# Patient Record
Sex: Female | Born: 1945
Health system: Southern US, Community
[De-identification: ages and names within clinical notes are randomized; demographics above are authoritative.]

## PROBLEM LIST (undated history)

## (undated) DIAGNOSIS — G43909 Migraine, unspecified, not intractable, without status migrainosus: Secondary | ICD-10-CM

## (undated) DIAGNOSIS — R7301 Impaired fasting glucose: Secondary | ICD-10-CM

## (undated) DIAGNOSIS — S4991XA Unspecified injury of right shoulder and upper arm, initial encounter: Secondary | ICD-10-CM

## (undated) DIAGNOSIS — N200 Calculus of kidney: Secondary | ICD-10-CM

## (undated) DIAGNOSIS — J309 Allergic rhinitis, unspecified: Secondary | ICD-10-CM

## (undated) DIAGNOSIS — S2232XA Fracture of one rib, left side, initial encounter for closed fracture: Secondary | ICD-10-CM

## (undated) DIAGNOSIS — M1712 Unilateral primary osteoarthritis, left knee: Secondary | ICD-10-CM

## (undated) DIAGNOSIS — M1990 Unspecified osteoarthritis, unspecified site: Secondary | ICD-10-CM

## (undated) DIAGNOSIS — Z1211 Encounter for screening for malignant neoplasm of colon: Secondary | ICD-10-CM

## (undated) DIAGNOSIS — I10 Essential (primary) hypertension: Secondary | ICD-10-CM

## (undated) DIAGNOSIS — E782 Mixed hyperlipidemia: Secondary | ICD-10-CM

## (undated) DIAGNOSIS — R51 Headache: Secondary | ICD-10-CM

## (undated) HISTORY — DX: Calculus of kidney: N20.0

## (undated) HISTORY — DX: Migraine, unspecified, not intractable, without status migrainosus: G43.909

## (undated) HISTORY — DX: Unspecified osteoarthritis, unspecified site: M19.90

## (undated) HISTORY — DX: Fracture of one rib, left side, initial encounter for closed fracture: S22.32XA

## (undated) HISTORY — DX: Allergic rhinitis, unspecified: J30.9

## (undated) HISTORY — DX: Impaired fasting glucose: R73.01

## (undated) HISTORY — DX: Unilateral primary osteoarthritis, left knee: M17.12

## (undated) HISTORY — DX: Headache: R51

## (undated) HISTORY — DX: Encounter for screening for malignant neoplasm of colon: Z12.11

## (undated) HISTORY — DX: Unspecified injury of right shoulder and upper arm, initial encounter: S49.91XA

## (undated) HISTORY — DX: Essential (primary) hypertension: I10

---
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
## 1997-10-30 ENCOUNTER — Other Ambulatory Visit: Admission: RE | Admit: 1997-10-30 | Discharge: 1997-10-30 | Payer: Self-pay | Admitting: Gynecology

## 2001-01-21 ENCOUNTER — Other Ambulatory Visit: Admission: RE | Admit: 2001-01-21 | Discharge: 2001-01-21 | Payer: Self-pay | Admitting: Obstetrics and Gynecology

## 2003-07-17 ENCOUNTER — Other Ambulatory Visit: Admission: RE | Admit: 2003-07-17 | Discharge: 2003-07-17 | Payer: Self-pay | Admitting: Obstetrics and Gynecology

## 2003-12-10 ENCOUNTER — Ambulatory Visit: Payer: Self-pay | Admitting: Internal Medicine

## 2004-03-14 ENCOUNTER — Ambulatory Visit: Payer: Self-pay | Admitting: Internal Medicine

## 2004-05-09 ENCOUNTER — Ambulatory Visit: Payer: Self-pay | Admitting: Internal Medicine

## 2004-08-15 ENCOUNTER — Ambulatory Visit: Payer: Self-pay | Admitting: Internal Medicine

## 2004-12-05 ENCOUNTER — Ambulatory Visit: Payer: Self-pay | Admitting: Internal Medicine

## 2005-02-12 ENCOUNTER — Ambulatory Visit: Payer: Self-pay | Admitting: Internal Medicine

## 2005-02-17 ENCOUNTER — Other Ambulatory Visit: Admission: RE | Admit: 2005-02-17 | Discharge: 2005-02-17 | Payer: Self-pay | Admitting: Obstetrics and Gynecology

## 2005-11-26 ENCOUNTER — Ambulatory Visit: Payer: Self-pay | Admitting: Internal Medicine

## 2006-01-21 ENCOUNTER — Ambulatory Visit: Payer: Self-pay | Admitting: Internal Medicine

## 2006-08-02 HISTORY — PX: COLONOSCOPY: SHX174

## 2006-10-05 ENCOUNTER — Ambulatory Visit: Payer: Self-pay | Admitting: Internal Medicine

## 2006-10-05 DIAGNOSIS — I10 Essential (primary) hypertension: Secondary | ICD-10-CM

## 2006-10-05 DIAGNOSIS — G4700 Insomnia, unspecified: Secondary | ICD-10-CM

## 2006-10-05 DIAGNOSIS — R002 Palpitations: Secondary | ICD-10-CM

## 2007-01-04 ENCOUNTER — Ambulatory Visit: Payer: Self-pay | Admitting: Internal Medicine

## 2007-01-04 DIAGNOSIS — J019 Acute sinusitis, unspecified: Secondary | ICD-10-CM

## 2007-08-24 ENCOUNTER — Telehealth: Payer: Self-pay | Admitting: Internal Medicine

## 2007-11-25 ENCOUNTER — Ambulatory Visit: Payer: Self-pay | Admitting: Internal Medicine

## 2007-11-25 DIAGNOSIS — G43909 Migraine, unspecified, not intractable, without status migrainosus: Secondary | ICD-10-CM | POA: Insufficient documentation

## 2008-02-27 ENCOUNTER — Ambulatory Visit: Payer: Self-pay | Admitting: Internal Medicine

## 2008-02-27 DIAGNOSIS — M949 Disorder of cartilage, unspecified: Secondary | ICD-10-CM

## 2008-02-27 DIAGNOSIS — R519 Headache, unspecified: Secondary | ICD-10-CM | POA: Insufficient documentation

## 2008-02-27 DIAGNOSIS — M899 Disorder of bone, unspecified: Secondary | ICD-10-CM | POA: Insufficient documentation

## 2008-02-27 DIAGNOSIS — R51 Headache: Secondary | ICD-10-CM

## 2008-02-27 LAB — CONVERTED CEMR LAB
Basophils Absolute: 0 10*3/uL (ref 0.0–0.1)
Basophils Relative: 0.8 % (ref 0.0–3.0)
Chloride: 106 meq/L (ref 96–112)
Creatinine, Ser: 0.6 mg/dL (ref 0.4–1.2)
Eosinophils Absolute: 0.2 10*3/uL (ref 0.0–0.7)
GFR calc non Af Amer: 108 mL/min
MCHC: 35 g/dL (ref 30.0–36.0)
MCV: 81.8 fL (ref 78.0–100.0)
Neutrophils Relative %: 68.7 % (ref 43.0–77.0)
Platelets: 218 10*3/uL (ref 150–400)
RBC: 4.88 M/uL (ref 3.87–5.11)

## 2008-03-06 LAB — CONVERTED CEMR LAB: Vit D, 1,25-Dihydroxy: 29 — ABNORMAL LOW (ref 30–89)

## 2008-06-05 ENCOUNTER — Ambulatory Visit: Payer: Self-pay | Admitting: Internal Medicine

## 2008-06-07 LAB — CONVERTED CEMR LAB: Vit D, 25-Hydroxy: 37 ng/mL (ref 30–89)

## 2008-08-20 ENCOUNTER — Ambulatory Visit: Payer: Self-pay | Admitting: Internal Medicine

## 2008-10-30 ENCOUNTER — Ambulatory Visit: Payer: Self-pay | Admitting: Internal Medicine

## 2008-10-30 DIAGNOSIS — E7800 Pure hypercholesterolemia, unspecified: Secondary | ICD-10-CM

## 2008-10-30 LAB — CONVERTED CEMR LAB
Basophils Absolute: 0 10*3/uL (ref 0.0–0.1)
Basophils Relative: 0.7 % (ref 0.0–3.0)
Chloride: 107 meq/L (ref 96–112)
Cholesterol: 178 mg/dL (ref 0–200)
Eosinophils Absolute: 0.1 10*3/uL (ref 0.0–0.7)
HDL: 42.2 mg/dL (ref >39.00)
LDL Cholesterol: 107 mg/dL — ABNORMAL HIGH (ref 0–99)
Lymphocytes Relative: 19.8 % (ref 12.0–46.0)
MCHC: 34.3 g/dL (ref 30.0–36.0)
Neutrophils Relative %: 69.1 % (ref 43.0–77.0)
Platelets: 215 10*3/uL (ref 150.0–400.0)
Potassium: 4 meq/L (ref 3.5–5.1)
RBC: 5.07 M/uL (ref 3.87–5.11)
RDW: 13.4 % (ref 11.5–14.6)
Sodium: 143 meq/L (ref 135–145)
Triglycerides: 146 mg/dL (ref 0.0–149.0)
VLDL: 29.2 mg/dL (ref 0.0–40.0)

## 2009-01-28 ENCOUNTER — Telehealth: Payer: Self-pay | Admitting: *Deleted

## 2009-02-04 ENCOUNTER — Ambulatory Visit: Payer: Self-pay | Admitting: Internal Medicine

## 2009-02-04 DIAGNOSIS — M549 Dorsalgia, unspecified: Secondary | ICD-10-CM | POA: Insufficient documentation

## 2009-02-15 ENCOUNTER — Telehealth: Payer: Self-pay | Admitting: Internal Medicine

## 2009-03-01 LAB — CONVERTED CEMR LAB: Pap Smear: NORMAL

## 2009-03-01 LAB — HM MAMMOGRAPHY

## 2009-03-08 ENCOUNTER — Telehealth: Payer: Self-pay | Admitting: Internal Medicine

## 2009-07-31 ENCOUNTER — Ambulatory Visit: Payer: Self-pay | Admitting: Internal Medicine

## 2009-07-31 LAB — CONVERTED CEMR LAB
ALT: 17 units/L (ref 0–35)
BUN: 16 mg/dL (ref 6–23)
Basophils Absolute: 0 10*3/uL (ref 0.0–0.1)
Bilirubin Urine: NEGATIVE
Bilirubin, Direct: 0.2 mg/dL (ref 0.0–0.3)
Chloride: 106 meq/L (ref 96–112)
Cholesterol: 165 mg/dL (ref 0–200)
Creatinine, Ser: 0.6 mg/dL (ref 0.4–1.2)
Eosinophils Absolute: 0.1 10*3/uL (ref 0.0–0.7)
Eosinophils Relative: 2 % (ref 0.0–5.0)
Glucose, Bld: 105 mg/dL — ABNORMAL HIGH (ref 70–99)
Glucose, Urine, Semiquant: NEGATIVE
HCT: 40.2 % (ref 36.0–46.0)
Ketones, urine, test strip: NEGATIVE
LDL Cholesterol: 98 mg/dL (ref 0–99)
Lymphs Abs: 1.1 10*3/uL (ref 0.7–4.0)
MCV: 83 fL (ref 78.0–100.0)
Monocytes Absolute: 0.5 10*3/uL (ref 0.1–1.0)
Neutrophils Relative %: 70.5 % (ref 43.0–77.0)
Platelets: 221 10*3/uL (ref 150.0–400.0)
Protein, U semiquant: NEGATIVE
RDW: 13.8 % (ref 11.5–14.6)
TSH: 0.58 microintl units/mL (ref 0.35–5.50)
Total Bilirubin: 0.9 mg/dL (ref 0.3–1.2)
Triglycerides: 148 mg/dL (ref 0.0–149.0)
Urobilinogen, UA: 0.2
VLDL: 29.6 mg/dL (ref 0.0–40.0)
WBC: 5.9 10*3/uL (ref 4.5–10.5)
pH: 6.5

## 2009-08-19 ENCOUNTER — Ambulatory Visit: Payer: Self-pay | Admitting: Internal Medicine

## 2009-12-10 ENCOUNTER — Ambulatory Visit: Payer: Self-pay | Admitting: Internal Medicine

## 2009-12-24 ENCOUNTER — Ambulatory Visit: Payer: Self-pay | Admitting: Internal Medicine

## 2009-12-24 DIAGNOSIS — J309 Allergic rhinitis, unspecified: Secondary | ICD-10-CM | POA: Insufficient documentation

## 2010-01-24 ENCOUNTER — Ambulatory Visit: Payer: Self-pay | Admitting: Internal Medicine

## 2010-03-04 ENCOUNTER — Ambulatory Visit
Admission: RE | Admit: 2010-03-04 | Discharge: 2010-03-04 | Payer: Self-pay | Source: Home / Self Care | Attending: Internal Medicine | Admitting: Internal Medicine

## 2010-03-04 NOTE — Assessment & Plan Note (Signed)
Summary: ?allergies/njr   Vital Signs:  Patient profile:   65 year old female Weight:      126 pounds Temp:     98.3 degrees F BP sitting:   150 / 90  (left arm) Cuff size:   regular  Vitals Entered By: Sid Falcon LPN (December 24, 2009 3:57 PM)  History of Present Illness: 65 year old patient who has had a 6-week history of mainly sinus congestion with postnasal drip and cough.  She has tried some OTC antihistamines with minimal benefit.  She seemed to improve, but then more recently has had increasing cough and rhinorrhea.  No fever or localized sinus pain, dental pain or purulent sputum production.  She has a history of treated hypertension, which has been stable  Allergies: 1)  ! Pcn  Past History:  Past Medical History: Reviewed history from 02/27/2008 and no changes required. Hypertension Headache  Review of Systems       The patient complains of hoarseness and prolonged cough.  The patient denies anorexia, fever, weight loss, weight gain, vision loss, decreased hearing, chest pain, syncope, dyspnea on exertion, peripheral edema, headaches, hemoptysis, abdominal pain, melena, hematochezia, severe indigestion/heartburn, hematuria, incontinence, genital sores, muscle weakness, suspicious skin lesions, transient blindness, difficulty walking, depression, unusual weight change, abnormal bleeding, enlarged lymph nodes, angioedema, and breast masses.    Physical Exam  General:  Well-developed,well-nourished,in no acute distress; alert,appropriate and cooperative throughout examination Head:  Normocephalic and atraumatic without obvious abnormalities. No apparent alopecia or balding. Eyes:  No corneal or conjunctival inflammation noted. EOMI. Perrla. Funduscopic exam benign, without hemorrhages, exudates or papilledema. Vision grossly normal. Ears:  External ear exam shows no significant lesions or deformities.  Otoscopic examination reveals clear canals, tympanic membranes are  intact bilaterally without bulging, retraction, inflammation or discharge. Hearing is grossly normal bilaterally. Mouth:  Oral mucosa and oropharynx without lesions or exudates.  Teeth in good repair. Neck:  No deformities, masses, or tenderness noted. Lungs:  Normal respiratory effort, chest expands symmetrically. Lungs are clear to auscultation, no crackles or wheezes.   Impression & Recommendations:  Problem # 1:  RHINITIS (ICD-477.9)  Her updated medication list for this problem includes:    Fluticasone Propionate 50 Mcg/act Susp (Fluticasone propionate) ..... Use daily  Orders: Depo- Medrol 80mg  (J1040) Admin of Therapeutic Inj  intramuscular or subcutaneous (36644)  Problem # 2:  HYPERTENSION (ICD-401.9)  Her updated medication list for this problem includes:    Ziac 5-6.25 Mg Tabs (Bisoprolol-hydrochlorothiazide) .Marland Kitchen... 1 two times a day  Complete Medication List: 1)  Ziac 5-6.25 Mg Tabs (Bisoprolol-hydrochlorothiazide) .Marland Kitchen.. 1 two times a day 2)  Bl Aspirin 325 Mg Tabs (Aspirin) 3)  Meloxicam 15 Mg Tabs (Meloxicam) .... One by mouth daily as needed 4)  Hydrocodone-homatropine 5-1.5 Mg/33ml Syrp (Hydrocodone-homatropine) .Marland Kitchen.. 1 teaspoon  every 6 hours as needed for cough 5)  Fluticasone Propionate 50 Mcg/act Susp (Fluticasone propionate) .... Use daily  Patient Instructions: 1)  Get plenty of rest, drink lots of clear liquids, and use Tylenol or Ibuprofen for fever and comfort. Return in 7-10 days if you're not better:sooner if you're feeling worse. Prescriptions: FLUTICASONE PROPIONATE 50 MCG/ACT SUSP (FLUTICASONE PROPIONATE) use daily  #1 x 0   Entered and Authorized by:   Gordy Savers  MD   Signed by:   Gordy Savers  MD on 12/24/2009   Method used:   Print then Give to Patient   RxID:   [id number] HYDROCODONE-HOMATROPINE 5-1.5 MG/5ML SYRP (HYDROCODONE-HOMATROPINE) 1 teaspoon  every 6 hours as needed for cough  #6 oz x 0   Entered and Authorized by:    Gordy Savers  MD   Signed by:   Gordy Savers  MD on 12/24/2009   Method used:   Print then Give to Patient   RxID:   [id number]    Medication Administration  Injection # 1:    Medication: Depo- Medrol 80mg     Diagnosis: RHINITIS (ICD-477.9)    Route: IM    Site: LUOQ gluteus    Exp Date: 06/02/2012    Lot #: [id number]    Mfr: Pharmacia    Patient tolerated injection without complications    Given by: Sid Falcon LPN (December 24, 2009 4:37 PM)  Orders Added: 1)  Est. Patient Level III [37169] 2)  Depo- Medrol 80mg  [J1040] 3)  Admin of Therapeutic Inj  intramuscular or subcutaneous [67893]

## 2010-03-04 NOTE — Progress Notes (Signed)
Summary: allergic reaction???  Phone Note Call from Patient   Caller: Patient Call For: Stacie Glaze MD Summary of Call: Pt worked in the hay barn, and is wheezing, hoarse, and cough.  Requesting RX.  No fever. [phone]  [facility] Had viral illness 10 days ago....congestion and sinus symptoms. Initial call taken by: Lynann Beaver CMA,  March 08, 2009 9:44 AM  Follow-up for Phone Call        allerx dose pack per dr Lovell Sheehan Follow-up by: Willy Eddy, LPN,  March 08, 2009 12:11 PM    New/Updated Medications: Selinda Eon DOSE PACK  MISC (PSE-METHSCOP & CPM-PE-METHSCOP) as directed Prescriptions: ALLERX DOSE PACK  MISC (PSE-METHSCOP & CPM-PE-METHSCOP) as directed  #1 pack x 0   Entered by:   Lynann Beaver CMA   Authorized by:   Stacie Glaze MD   Signed by:   Lynann Beaver CMA on 03/08/2009   Method used:   Electronically to        [facility]  [phone]* (retail)       [address]       [place], [place]  [address]       Ph: [phone] or [phone]       Fax: [phone]   RxID:   [id number]

## 2010-03-04 NOTE — Assessment & Plan Note (Signed)
Summary: 3 MTH ROV // RS   Vital Signs:  Patient profile:   65 year old female Height:      66 inches Weight:      128 pounds BMI:     20.73 Temp:     98.2 degrees F oral Pulse rate:   72 / minute Resp:     14 per minute BP sitting:   134 / 80  (left arm)  Vitals Entered By: Willy Eddy, LPN (February 04, 2009 3:36 PM) CC: roa- c/o low back pain, Hypertension Management, Lipid Management, Back pain   CC:  roa- c/o low back pain, Hypertension Management, Lipid Management, and Back pain.  History of Present Illness: monitering labs from the last visit with normal lipids and HTN  Back Pain      This is a 65 year old woman who presents with Back pain.  The patient denies fever, chills, weakness, loss of sensation, fecal incontinence, urinary incontinence, urinary retention, dysuria, rest pain, inability to work, and inability to care for self.  The pain is located in the left low back and left SI joint.  The pain began after lifting and after straining.  The pain radiates to the left buttock.  The pain is made worse by flexion and extension.  The pain is made better by acetaminophen.    Hypertension History:      She denies headache, chest pain, palpitations, dyspnea with exertion, orthopnea, PND, peripheral edema, visual symptoms, neurologic problems, syncope, and side effects from treatment.        Positive major cardiovascular risk factors include female age 23 years old or older, hyperlipidemia, and hypertension.  Negative major cardiovascular risk factors include no history of diabetes, negative family history for ischemic heart disease, and non-tobacco-user status.        Further assessment for target organ damage reveals no history of ASHD, stroke/TIA, or peripheral vascular disease.    Lipid Management History:      Positive NCEP/ATP III risk factors include female age 25 years old or older and hypertension.  Negative NCEP/ATP III risk factors include no history of early  menopause without estrogen hormone replacement, non-diabetic, no family history for ischemic heart disease, non-tobacco-user status, no ASHD (atherosclerotic heart disease), no prior stroke/TIA, no peripheral vascular disease, and no history of aortic aneurysm.      Preventive Screening-Counseling & Management  Alcohol-Tobacco     Smoking Status: never     Passive Smoke Exposure: yes  Allergies: 1)  ! 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  Past Medical History: Reviewed history from 02/27/2008 and no changes required. Hypertension Headache  Past Surgical History: Reviewed history from 10/05/2006 and no changes required. Denies surgical history  Family History: Reviewed history from 10/05/2006 and no changes required. Family History Hypertension Family History Weight disorder Family History of Cardiovascular disorder  Social History: Reviewed history from 10/05/2006 and no changes required. 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    Physical Exam  General:  Well-developed,well-nourished,in no acute distress; alert,appropriate and cooperative throughout examination Head:  normocephalic and atraumatic.   Eyes:  pupils equal and pupils round.   Ears:  R ear normal and L ear normal.   Nose:  no external deformity and no nasal discharge.   Mouth:  pharynx pink and moist and no erythema.   Neck:  No deformities, masses, or tenderness noted. Lungs:  Normal respiratory effort, chest expands symmetrically. Lungs are clear to auscultation, no crackles or wheezes. Heart:  normal rate and regular rhythm.   Abdomen:  soft, no masses, and no splenomegaly.   Msk:  joint tenderness and joint swelling.   Neurologic:  alert & oriented X3 and cranial nerves II-XII intact.     Impression & Recommendations:  Problem # 1:  BACK PAIN, ACUTE (ICD-724.5) fleximid and naprelyn samples for 3-5 nights Her updated medication list for this problem includes:    Bl Aspirin 325 Mg Tabs (Aspirin)    Meloxicam 15 Mg Tabs (Meloxicam) ..... One by mouth daily as needed  Discussed use of moist heat or ice, modified activities, medications, and stretching/strengthening exercises. Back care instructions given. To be seen in 2 weeks if no improvement; sooner if worsening of symptoms.   Problem # 2:  HYPERCHOLESTEROLEMIA, MILD (ICD-272.0) at goal Lipid Goals: Chol Goal: 200 (02/04/2009)   HDL Goal: 40 (02/04/2009)   LDL Goal: 130 (02/04/2009)   TG Goal: 150 (02/04/2009)  10 Yr Risk Heart Disease: 13 % Prior 10 Yr Risk Heart Disease: Not enough information (02/27/2008)   HDL:42.20 (10/30/2008)  LDL:107 (10/30/2008)  Chol:178 (10/30/2008)  Trig:146.0 (10/30/2008)  Problem # 3:  HYPERTENSION (ICD-401.9)  Her updated medication list for this problem includes:    Ziac 5-6.25 Mg Tabs (Bisoprolol-hydrochlorothiazide) .Marland Kitchen... 1 two times a day  BP today: 134/80 Prior BP: 130/80 (10/30/2008)  10 Yr Risk Heart Disease: 13 % Prior 10 Yr Risk  Heart Disease: Not enough information (02/27/2008)  Labs Reviewed: K+: 4.0 (10/30/2008) Creat: : 0.6 (10/30/2008)   Chol: 178 (10/30/2008)   HDL: 42.20 (10/30/2008)   LDL: 107 (10/30/2008)   TG: 146.0 (10/30/2008)  Complete Medication List: 1)  Allerx Pe 40-2.5&8-10-2.5 Mg Misc (Chlorphen-phenyleph-methscop) .Marland Kitchen.. 1 once daily 2)  Ziac 5-6.25 Mg Tabs (Bisoprolol-hydrochlorothiazide) .Marland Kitchen.. 1 two times a day 3)  Bl Aspirin 325 Mg Tabs (Aspirin) 4)  Meloxicam 15 Mg Tabs (Meloxicam) .... One by mouth daily as needed 5)  Vitamin D 16109 Unit Caps (Ergocalciferol) .Marland Kitchen.. 1 every week for 12 weeks  Hypertension Assessment/Plan:      The patient's hypertensive risk group is category B: At least one risk factor (excluding diabetes) with no target organ damage.  Her calculated 10 year risk of coronary heart disease is 13 %.  Today's blood pressure is 134/80.  Her blood pressure goal is < 140/90.  Lipid Assessment/Plan:      Based on NCEP/ATP III, the patient's risk factor category is "2 or more risk factors and a calculated 10 year CAD risk of < 20%".  The patient's lipid goals are as follows: Total cholesterol goal is 200; LDL cholesterol goal is 130; HDL cholesterol goal is 40; Triglyceride goal is 150.  Her LDL cholesterol goal has been met.    Patient Instructions: 1)  Please schedule a follow-up appointment  in 6 months. cpx

## 2010-03-04 NOTE — Assessment & Plan Note (Signed)
Summary: FLU-SHOT/RCD  Nurse Visit   Review of Systems       Flu Vaccine Consent Questions     Do you have a history of severe allergic reactions to this vaccine? no    Any prior history of allergic reactions to egg and/or gelatin? no    Do you have a sensitivity to the preservative Thimersol? no    Do you have a past history of Guillan-Barre Syndrome? no    Do you currently have an acute febrile illness? no    Have you ever had a severe reaction to latex? no    Vaccine information given and explained to patient? yes    Are you currently pregnant? no    Lot Number:[id number]   Exp Date:08/02/2010   Site Given  Left Deltoid IM    Allergies: 1)  ! Pcn  Orders Added: 1)  Admin 1st Vaccine [90471] 2)  Flu Vaccine 59yrs + [[address]109]

## 2010-03-04 NOTE — Progress Notes (Signed)
Summary: drugs requested  Phone Note Call from Patient Call back at Home Phone [phone]   Caller: live Call For: Sandy Gill Reason for Call: Acute Illness Summary of Call: Need drugs for the low back & into left leg pain.  Talked to Dr. Shela Commons about it, lifted #20 50 llb bags of sand to help daughter.   Used up the samples given high dosenaproxen sodium x 3 & muscle relaxer x3.  Seeing chiropractor, helping.  [facility] [phone].  Allergic to penicillin.   Initial call taken by: Rudy Jew, RN,  February 15, 2009 10:16 AM  Follow-up for Phone Call        per dr Lovell Sheehan- may have tramadol 1 with tylneol 325 1 every 4-6 hours as needed pain #30- 0 refill Follow-up by: Willy Eddy, LPN,  February 15, 2009 2:12 PM    New/Updated Medications: TRAMADOL HCL 50 MG TABS (TRAMADOL HCL) 1 with 1 tylenol 325 every 4-6 hours as needed pain Prescriptions: TRAMADOL HCL 50 MG TABS (TRAMADOL HCL) 1 with 1 tylenol 325 every 4-6 hours as needed pain  #30 x 0   Entered by:   Willy Eddy, LPN   Authorized by:   Stacie Glaze MD   Signed by:   Willy Eddy, LPN on 51/88/4166   Method used:   Electronically to        [facility]  [phone]* (retail)       [address]       [place], [place]  [address]       Ph: [phone] or [phone]       Fax: [phone]   RxID:   [id number]   Appended Document: drugs requested called into pharmacy and left message on machine for pt

## 2010-03-04 NOTE — Assessment & Plan Note (Signed)
Summary: cpx/cjr/pt rescd from bump//ccm   Vital Signs:  Patient profile:   65 year old female Height:      66 inches Weight:      126 pounds BMI:     20.41 Temp:     98.2 degrees F oral Pulse rate:   65 / minute Resp:     14 per minute BP sitting:   134 / 84  (left arm)  Vitals Entered By: Willy Eddy, LPN (August 19, 2009 2:34 PM) CC: cpx   CC:  cpx.  History of Present Illness: The pt was asked about all immunizations, health maint. services that are appropriate to their age and was given guidance on diet exercize  and weight management   Preventive Screening-Counseling & Management  Alcohol-Tobacco     Smoking Status: never     Passive Smoke Exposure: yes  Problems Prior to Update: 1)  Preventive Health Care  (ICD-V70.0) 2)  Back Pain, Acute  (ICD-724.5) 3)  Hypercholesterolemia, Mild  (ICD-272.0) 4)  Osteopenia  (ICD-733.90) 5)  Headache  (ICD-784.0) 6)  Migraine Headache  (ICD-346.90) 7)  Acute Sinusitis, Unspecified  (ICD-461.9) 8)  Insomnia, Persistent  (ICD-307.42) 9)  Palpitations  (ICD-785.1) 10)  Hypertension  (ICD-401.9)  Current Problems (verified): 1)  Back Pain, Acute  (ICD-724.5) 2)  Hypercholesterolemia, Mild  (ICD-272.0) 3)  Osteopenia  (ICD-733.90) 4)  Headache  (ICD-784.0) 5)  Migraine Headache  (ICD-346.90) 6)  Acute Sinusitis, Unspecified  (ICD-461.9) 7)  Insomnia, Persistent  (ICD-307.42) 8)  Palpitations  (ICD-785.1) 9)  Hypertension  (ICD-401.9)  Medications Prior to Update: 1)  Allerx Pe 40-2.5&8-10-2.5 Mg Misc (Chlorphen-Phenyleph-Methscop) .Marland Kitchen.. 1 Once Daily 2)  Ziac 5-6.25 Mg Tabs (Bisoprolol-Hydrochlorothiazide) .Marland Kitchen.. 1 Two Times A Day 3)  Bl Aspirin 325 Mg  Tabs (Aspirin) 4)  Meloxicam 15 Mg Tabs (Meloxicam) .... One By Mouth Daily As Needed 5)  Vitamin D 57846 Unit Caps (Ergocalciferol) .Marland Kitchen.. 1 Every Week For 12 Weeks 6)  Tramadol Hcl 50 Mg Tabs (Tramadol Hcl) .Marland Kitchen.. 1 With 1 Tylenol 325 Every 4-6 Hours As Needed Pain 7)   Allerx Dose Pack  Misc (Pse-Methscop & Cpm-Pe-Methscop) .... As Directed  Current Medications (verified): 1)  Ziac 5-6.25 Mg Tabs (Bisoprolol-Hydrochlorothiazide) .Marland Kitchen.. 1 Two Times A Day 2)  Bl Aspirin 325 Mg  Tabs (Aspirin) 3)  Meloxicam 15 Mg Tabs (Meloxicam) .... One By Mouth Daily As Needed  Allergies (verified): 1)  ! 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  Past Medical History: Reviewed history from 02/27/2008 and no changes required. Hypertension Headache  Past Surgical History: Reviewed history from 10/05/2006 and no changes required. Denies surgical history  Family History: Reviewed history from 10/05/2006 and no changes required. Family History Hypertension Family History Weight disorder Family History of Cardiovascular disorder  Social History: Reviewed history from 10/05/2006 and no changes required. 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    Physical Exam  General:  Well-developed,well-nourished,in no acute distress;  alert,appropriate and cooperative throughout examination Head:  normocephalic and atraumatic.   Eyes:  pupils equal and pupils round.   Ears:  R ear normal and L ear normal.   Nose:  no external deformity and no nasal discharge.   Mouth:  pharynx pink and moist and no erythema.   Neck:  No deformities, masses, or tenderness noted. Lungs:  Normal respiratory effort, chest expands symmetrically. Lungs are clear to auscultation, no crackles or wheezes. Heart:  normal rate and regular rhythm.   Abdomen:  soft, no masses, and no splenomegaly.   Msk:  joint tenderness and joint swelling.   Pulses:  R and L carotid,radial,femoral,dorsalis pedis and posterior tibial pulses are full and equal bilaterally Extremities:  No clubbing, cyanosis, edema, or deformity noted with normal full range of motion of all joints.   Neurologic:  alert & oriented X3 and cranial nerves II-XII intact.   Cervical Nodes:  No lymphadenopathy noted Axillary Nodes:  No palpable lymphadenopathy Psych:  Oriented X3 and normally interactive.     Impression & Recommendations:  Problem # 1:  PREVENTIVE HEALTH CARE (ICD-V70.0)  The pt was asked about all immunizations, health maint. services that are appropriate to their age and was given guidance on diet exercize  and weight management  Mammogram: normal (03/01/2009) Pap smear: normal (03/01/2009) Colonoscopy: normal (07/02/2006) Td Booster: Tdap (08/19/2009)   Flu Vax: Fluvax 3+ (10/30/2008)   Chol: 165 (07/31/2009)   HDL: 37.30 (07/31/2009)   LDL: 98 (07/31/2009)   TG: 148.0 (07/31/2009) TSH: 0.58 (07/31/2009)   Next mammogram due:: 03/2010 (08/19/2009) Next Colonoscopy due:: 07/2016 (08/19/2009)  Discussed using sunscreen, use of alcohol, drug use, self breast exam, routine dental care, routine eye care, schedule for GYN exam, routine physical exam, seat belts, multiple vitamins, osteoporosis prevention, adequate calcium intake in diet, recommendations for immunizations,  mammograms and Pap smears.  Discussed exercise and checking cholesterol.  Discussed gun safety, safe sex, and contraception.  Complete Medication List: 1)  Ziac 5-6.25 Mg Tabs (Bisoprolol-hydrochlorothiazide) .Marland Kitchen.. 1 two times a day 2)  Bl Aspirin 325 Mg Tabs (Aspirin) 3)  Meloxicam 15 Mg Tabs (Meloxicam) .... One by mouth daily as needed  Other Orders: Tdap => 73yrs IM ([address]) Admin 1st Vaccine (60454) EKG w/ Interpretation (93000)  Patient Instructions: 1)  Please schedule a follow-up appointment in 6 months.   Preventive Care Screening  Colonoscopy:    Date:  07/02/2006    Next Due:  07/2016    Results:  normal   Mammogram:    Date:  03/01/2009    Next Due:  03/2010    Results:  normal   Pap Smear:    Date:  03/01/2009    Next Due:  03/2010    Results:  normal     Immunizations Administered:  Tetanus Vaccine:    Vaccine Type: Tdap    Site: left deltoid    Mfr: GlaxoSmithKline    Dose: 0.5 ml    Route: IM    Given by: Willy Eddy, LPN    Exp. Date: 02/21/2011    Lot #: [id number]

## 2010-03-06 NOTE — Assessment & Plan Note (Signed)
Summary: ? strept throat/dm   Vital Signs:  Patient profile:   65 year old female Weight:      125 pounds Temp:     98.0 degrees F Pulse rate:   70 / minute BP sitting:   122 / 78  Vitals Entered By: Kyung Rudd, CMA (January 24, 2010 11:55 AM) CC: ??strep   CC:  ??strep.  History of Present Illness: Patient presents to clinic as a workin for evaluation of sore throat. Notes 2 day h/o nasal drainage and congestion with chills but no subjective fever. Has confirmed exposure to strep with her granddaughter being diagnosed recently and currently on abx. Took asa with improvement of ST. No other alleviating or exacerbating factors. Denies other complaint.  Current Medications (verified): 1)  Ziac 5-6.25 Mg Tabs (Bisoprolol-Hydrochlorothiazide) .Marland Kitchen.. 1 Two Times A Day 2)  Bl Aspirin 325 Mg  Tabs (Aspirin) 3)  Meloxicam 15 Mg Tabs (Meloxicam) .... One By Mouth Daily As Needed 4)  Hydrocodone-Homatropine 5-1.5 Mg/82ml Syrp (Hydrocodone-Homatropine) .Marland Kitchen.. 1 Teaspoon  Every 6 Hours As Needed For Cough  Allergies: 1)  ! Pcn 2)  ! Sulfa  Review of Systems      See HPI General:  Complains of chills; denies fever, malaise, and sweats. ENT:  Complains of postnasal drainage and sore throat; denies difficulty swallowing and hoarseness. Resp:  Denies cough and shortness of breath. Derm:  Denies changes in color of skin, flushing, itching, and rash.  Physical Exam  General:  Well-developed,well-nourished,in no acute distress; alert,appropriate and cooperative throughout examination Head:  Normocephalic and atraumatic without obvious abnormalities. No apparent alopecia or balding. Eyes:  vision grossly intact, pupils equal, pupils round, corneas and lenses clear, and no injection.   Ears:  External ear exam shows no significant lesions or deformities.  Otoscopic examination reveals clear canals, tympanic membranes are intact bilaterally without bulging, retraction, inflammation or discharge.  Hearing is grossly normal bilaterally. Nose:  External nasal examination shows no deformity or inflammation. Nasal mucosa are pink and moist without lesions or exudates. Mouth:  Oral mucosa and oropharynx without lesions or exudates.  Mild posterior erythema. Neck:  No deformities, masses, or tenderness noted. Lungs:  Normal respiratory effort, chest expands symmetrically. Lungs are clear to auscultation, no crackles or wheezes. Skin:  Intact without suspicious lesions or rashesturgor normal and color normal.     Impression & Recommendations:  Problem # 1:  ACUTE PHARYNGITIS (ICD-462) Assessment New High probability for strep pharyngitis given recent direct confirmed exposure and symptoms. Defer rapid strep and culture and begin empiric treatment with antibiotic. Followup if no improvement or worsening. Her updated medication list for this problem includes:    Levaquin 500 Mg Tabs (Levofloxacin) ..... One by mouth qd  Complete Medication List: 1)  Ziac 5-6.25 Mg Tabs (Bisoprolol-hydrochlorothiazide) .Marland Kitchen.. 1 two times a day 2)  Bl Aspirin 325 Mg Tabs (Aspirin) 3)  Meloxicam 15 Mg Tabs (Meloxicam) .... One by mouth daily as needed 4)  Hydrocodone-homatropine 5-1.5 Mg/59ml Syrp (Hydrocodone-homatropine) .Marland Kitchen.. 1 teaspoon  every 6 hours as needed for cough 5)  Levaquin 500 Mg Tabs (Levofloxacin) .... One by mouth qd Prescriptions: LEVAQUIN 500 MG TABS (LEVOFLOXACIN) one by mouth qd  #7 x 0   Entered and Authorized by:   Edwyna Perfect MD   Signed by:   Edwyna Perfect MD on 01/24/2010   Method used:   Electronically to        [facility]  [phone]* (retail)  [address]       [place], [place]  [address]       Ph: [phone] or [phone]       Fax: [phone]   RxID:   [id number]    Orders Added: 1)  Est. Patient Level IV [27253]

## 2010-03-12 NOTE — Assessment & Plan Note (Signed)
Summary: 6 month fup//ccm/pt rsc/cjr   Vital Signs:  Patient profile:   65 year old female Height:      66 inches Weight:      126 pounds BMI:     20.41 Temp:     98.2 degrees F oral Pulse rate:   72 / minute Resp:     14 per minute BP sitting:   140 / 84  (left arm)  Vitals Entered By: Willy Eddy, LPN (March 04, 2010 11:31 AM) CC: roa, Hypertension Management Is Patient Diabetic? No   Primary Care Provider:  Stacie Glaze MD  CC:  roa and Hypertension Management.  History of Present Illness:  Patient is a 65 year old white female who is followed for hypertension hyperlipidemia and a history of headaches she has been doing stable on her medications and generally well she has significant increased stress in her family she is the caregiver... but she has done remarkably well. the patient has a new complaint today of allergies that began in October severe vasomotor rhinitis clearing her throat and slight cough.    Hypertension History:      She denies headache, chest pain, palpitations, dyspnea with exertion, orthopnea, PND, peripheral edema, visual symptoms, neurologic problems, syncope, and side effects from treatment.        Positive major cardiovascular risk factors include female age 64 years old or older, hyperlipidemia, and hypertension.  Negative major cardiovascular risk factors include no history of diabetes, negative family history for ischemic heart disease, and non-tobacco-user status.        Further assessment for target organ damage reveals no history of ASHD, stroke/TIA, or peripheral vascular disease.     Preventive Screening-Counseling & Management  Alcohol-Tobacco     Smoking Status: never     Passive Smoke Exposure: yes  Problems Prior to Update: 1)  Rhinitis  (ICD-477.9) 2)  Preventive Health Care  (ICD-V70.0) 3)  Back Pain, Acute  (ICD-724.5) 4)  Hypercholesterolemia, Mild  (ICD-272.0) 5)  Osteopenia  (ICD-733.90) 6)  Headache   (ICD-784.0) 7)  Migraine Headache  (ICD-346.90) 8)  Acute Sinusitis, Unspecified  (ICD-461.9) 9)  Insomnia, Persistent  (ICD-307.42) 10)  Palpitations  (ICD-785.1) 11)  Hypertension  (ICD-401.9)  Current Problems (verified): 1)  Rhinitis  (ICD-477.9) 2)  Preventive Health Care  (ICD-V70.0) 3)  Back Pain, Acute  (ICD-724.5) 4)  Hypercholesterolemia, Mild  (ICD-272.0) 5)  Osteopenia  (ICD-733.90) 6)  Headache  (ICD-784.0) 7)  Migraine Headache  (ICD-346.90) 8)  Acute Sinusitis, Unspecified  (ICD-461.9) 9)  Insomnia, Persistent  (ICD-307.42) 10)  Palpitations  (ICD-785.1) 11)  Hypertension  (ICD-401.9)  Medications Prior to Update: 1)  Ziac 5-6.25 Mg Tabs (Bisoprolol-Hydrochlorothiazide) .Marland Kitchen.. 1 Two Times A Day 2)  Bl Aspirin 325 Mg  Tabs (Aspirin) 3)  Meloxicam 15 Mg Tabs (Meloxicam) .... One By Mouth Daily As Needed 4)  Hydrocodone-Homatropine 5-1.5 Mg/39ml Syrp (Hydrocodone-Homatropine) .Marland Kitchen.. 1 Teaspoon  Every 6 Hours As Needed For Cough 5)  Levaquin 500 Mg Tabs (Levofloxacin) .... One By Mouth Qd  Current Medications (verified): 1)  Ziac 5-6.25 Mg Tabs (Bisoprolol-Hydrochlorothiazide) .Marland Kitchen.. 1 Two Times A Day 2)  Bl Aspirin 325 Mg  Tabs (Aspirin) 3)  Meloxicam 15 Mg Tabs (Meloxicam) .... One By Mouth Daily As Needed 4)  Hydrocodone-Homatropine 5-1.5 Mg/65ml Syrp (Hydrocodone-Homatropine) .Marland Kitchen.. 1 Teaspoon  Every 6 Hours As Needed For Cough 5)  Levaquin 500 Mg Tabs (Levofloxacin) .... One By Mouth Qd  Allergies (verified): 1)  ! Pcn 2)  !  Sulfa  Past History:  Family History: Last updated: 10/05/2006 Family History Hypertension Family History Weight disorder Family History of Cardiovascular disorder  Social History: Last updated: 10/05/2006 Occupation: Sales Married Never Smoked  Risk Factors: Smoking Status: never (03/04/2010) Passive Smoke Exposure: yes (03/04/2010)  Past medical, surgical, family and social histories (including risk factors) reviewed, and no changes  noted (except as noted below).  Past Medical History: Reviewed history from 02/27/2008 and no changes required. Hypertension Headache  Past Surgical History: Reviewed history from 10/05/2006 and no changes required. Denies surgical history  Family History: Reviewed history from 10/05/2006 and no changes required. Family History Hypertension Family History Weight disorder Family History of Cardiovascular disorder  Social History: Reviewed history from 10/05/2006 and no changes required. Occupation: Airline pilot Married Never Smoked  Review of Systems  The patient denies anorexia, fever, weight loss, weight gain, vision loss, decreased hearing, hoarseness, chest pain, syncope, dyspnea on exertion, peripheral edema, prolonged cough, headaches, hemoptysis, abdominal pain, melena, hematochezia, severe indigestion/heartburn, hematuria, incontinence, genital sores, muscle weakness, suspicious skin lesions, transient blindness, difficulty walking, depression, unusual weight change, abnormal bleeding, enlarged lymph nodes, angioedema, breast masses, and testicular masses.          vasomotor rhinitis  Physical Exam  General:  Well-developed,well-nourished,in no acute distress; alert,appropriate and cooperative throughout examination Head:  Normocephalic and atraumatic without obvious abnormalities. No apparent alopecia or balding. Eyes:  vision grossly intact, pupils equal, pupils round, corneas and lenses clear, and no injection.   Ears:  R ear normal and L ear normal.   Nose:  no external deformity and no nasal discharge.   Mouth:  good dentition and pharynx pink and moist.   Neck:  No deformities, masses, or tenderness noted. Lungs:  Normal respiratory effort, chest expands symmetrically. Lungs are clear to auscultation, no crackles or wheezes. Abdomen:  soft, no masses, and no splenomegaly.   Msk:  joint tenderness and joint swelling.   Pulses:  R and L carotid,radial,femoral,dorsalis  pedis and posterior tibial pulses are full and equal bilaterally Extremities:  No clubbing, cyanosis, edema, or deformity noted with normal full range of motion of all joints.   Neurologic:  alert & oriented X3 and cranial nerves II-XII intact.     Impression & Recommendations:  Problem # 1:  RHINITIS (ICD-477.9) Assessment Deteriorated added nasonex two sprays in nostril daily Discussed use of allergy medications and environmental measures.   Problem # 2:  HYPERTENSION (ICD-401.9) Assessment: Unchanged stable Her updated medication list for this problem includes:    Ziac 5-6.25 Mg Tabs (Bisoprolol-hydrochlorothiazide) .Marland Kitchen... 1 two times a day  BP today: 140/84 Prior BP: 122/78 (01/24/2010)  Prior 10 Yr Risk Heart Disease: 13 % (02/04/2009)  Labs Reviewed: K+: 4.8 (07/31/2009) Creat: : 0.6 (07/31/2009)   Chol: 165 (07/31/2009)   HDL: 37.30 (07/31/2009)   LDL: 98 (07/31/2009)   TG: 148.0 (07/31/2009)  Problem # 3:  MIGRAINE HEADACHE (ICD-346.90) patter stable refill medications Her updated medication list for this problem includes:    Ziac 5-6.25 Mg Tabs (Bisoprolol-hydrochlorothiazide) .Marland Kitchen... 1 two times a day    Bl Aspirin 325 Mg Tabs (Aspirin)    Meloxicam 15 Mg Tabs (Meloxicam) ..... One by mouth daily as needed  Complete Medication List: 1)  Ziac 5-6.25 Mg Tabs (Bisoprolol-hydrochlorothiazide) .Marland Kitchen.. 1 two times a day 2)  Bl Aspirin 325 Mg Tabs (Aspirin) 3)  Meloxicam 15 Mg Tabs (Meloxicam) .... One by mouth daily as needed 4)  Hydrocodone-homatropine 5-1.5 Mg/1ml Syrp (Hydrocodone-homatropine) .Marland Kitchen.. 1 teaspoon  every 6 hours as needed for cough 5)  Levaquin 500 Mg Tabs (Levofloxacin) .... One by mouth qd  Hypertension Assessment/Plan:      The patient's hypertensive risk group is category B: At least one risk factor (excluding diabetes) with no target organ damage.  Her calculated 10 year risk of coronary heart disease is 13 %.  Today's blood pressure is 140/84.  Her blood  pressure goal is < 140/90.  Patient Instructions: 1)  Please schedule a follow-up appointment in 6 months. cpx   Orders Added: 1)  Est. Patient Level IV [08657]

## 2010-08-26 ENCOUNTER — Encounter: Payer: Self-pay | Admitting: Internal Medicine

## 2010-08-29 ENCOUNTER — Other Ambulatory Visit (INDEPENDENT_AMBULATORY_CARE_PROVIDER_SITE_OTHER): Payer: BC Managed Care – PPO

## 2010-08-29 DIAGNOSIS — Z0000 Encounter for general adult medical examination without abnormal findings: Secondary | ICD-10-CM

## 2010-08-29 LAB — BASIC METABOLIC PANEL
GFR: 97.52 mL/min (ref >60.00)
Potassium: 5.1 mEq/L (ref 3.5–5.1)
Sodium: 140 mEq/L (ref 135–145)

## 2010-08-29 LAB — POCT URINALYSIS DIPSTICK
Glucose, UA: NEGATIVE
Spec Grav, UA: 1.01
Urobilinogen, UA: 0.2

## 2010-08-29 LAB — CBC WITH DIFFERENTIAL/PLATELET
Eosinophils Relative: 2.5 % (ref 0.0–5.0)
HCT: 41.7 % (ref 36.0–46.0)
Hemoglobin: 14.1 g/dL (ref 12.0–15.0)
Lymphs Abs: 1.1 10*3/uL (ref 0.7–4.0)
Monocytes Relative: 8 % (ref 3.0–12.0)
Neutro Abs: 4.8 10*3/uL (ref 1.4–7.7)
WBC: 6.7 10*3/uL (ref 4.5–10.5)

## 2010-08-29 LAB — HEPATIC FUNCTION PANEL
ALT: 16 U/L (ref 0–35)
AST: 22 U/L (ref 0–37)
Albumin: 4.5 g/dL (ref 3.5–5.2)
Total Bilirubin: 0.8 mg/dL (ref 0.3–1.2)

## 2010-08-29 LAB — TSH: TSH: 0.76 u[IU]/mL (ref 0.35–5.50)

## 2010-08-29 LAB — LIPID PANEL
Cholesterol: 172 mg/dL (ref 0–200)
LDL Cholesterol: 98 mg/dL (ref 0–99)

## 2010-09-02 ENCOUNTER — Other Ambulatory Visit: Payer: Self-pay | Admitting: Internal Medicine

## 2010-09-05 ENCOUNTER — Encounter: Payer: Self-pay | Admitting: Internal Medicine

## 2010-09-05 ENCOUNTER — Ambulatory Visit (INDEPENDENT_AMBULATORY_CARE_PROVIDER_SITE_OTHER): Payer: BC Managed Care – PPO | Admitting: Internal Medicine

## 2010-09-05 VITALS — BP 132/82 | HR 72 | Temp 98.2°F | Resp 16 | Ht 64.0 in | Wt 125.0 lb

## 2010-09-05 DIAGNOSIS — Z0000 Encounter for general adult medical examination without abnormal findings: Secondary | ICD-10-CM

## 2010-09-05 MED ORDER — MELOXICAM 15 MG PO TABS: 15.0000 mg | ORAL_TABLET | Freq: Every day | ORAL | Status: DC | PRN

## 2010-09-05 NOTE — Progress Notes (Signed)
  Subjective:    Patient ID: Sandy Gill, female    DOB: [birth year], 65 y.o.   MRN: [id number]  HPI CPX  She reports no complaints other than the stress she experiences from her family members chronic disease   Review of Systems  Constitutional: Negative for activity change, appetite change and fatigue.  HENT: Negative for ear pain, congestion, neck pain, postnasal drip and sinus pressure.   Eyes: Negative for redness and visual disturbance.  Respiratory: Negative for cough, shortness of breath and wheezing.   Gastrointestinal: Negative for abdominal pain and abdominal distention.  Genitourinary: Negative for dysuria, frequency and menstrual problem.  Musculoskeletal: Negative for myalgias, joint swelling and arthralgias.  Skin: Negative for rash and wound.  Neurological: Negative for dizziness, weakness and headaches.  Hematological: Negative for adenopathy. Does not bruise/bleed easily.  Psychiatric/Behavioral: Negative for sleep disturbance and decreased concentration.   Past Medical History  Diagnosis Date  . Hypertension   . Headache    No past surgical history on file.  reports that she has never smoked. She does not have any smokeless tobacco history on file. She reports that she does not drink alcohol or use illicit drugs. family history includes Coronary artery disease in an unspecified family member and Hypertension in an unspecified family member. Allergies  Allergen Reactions  . Penicillins   . Sulfonamide Derivatives        Objective:   Physical Exam  Nursing note and vitals reviewed. Constitutional: She is oriented to person, place, and time. She appears well-developed and well-nourished. No distress.  HENT:  Head: Normocephalic and atraumatic.  Right Ear: External ear normal.  Left Ear: External ear normal.  Nose: Nose normal.  Mouth/Throat: Oropharynx is clear and moist.  Eyes: Conjunctivae and EOM are normal. Pupils are equal, round, and reactive  to light.  Neck: Normal range of motion. Neck supple. No JVD present. No tracheal deviation present. No thyromegaly present.  Cardiovascular: Normal rate, regular rhythm, normal heart sounds and intact distal pulses.   No murmur heard. Pulmonary/Chest: Effort normal and breath sounds normal. She has no wheezes. She exhibits no tenderness.  Abdominal: Soft. Bowel sounds are normal.  Musculoskeletal: Normal range of motion. She exhibits no edema and no tenderness.  Lymphadenopathy:    She has no cervical adenopathy.  Neurological: She is alert and oriented to person, place, and time. She has normal reflexes. No cranial nerve deficit.  Skin: Skin is warm and dry. She is not diaphoretic.  Psychiatric: She has a normal mood and affect. Her behavior is normal.          Assessment & Plan:   This is a routine physical examination for this healthy  Female. Reviewed all health maintenance protocols including mammography colonoscopy bone density and reviewed appropriate screening labs. Her immunization history was reviewed as well as her current medications and allergies refills of her chronic medications were given and the plan for yearly health maintenance was discussed all orders and referrals were made as appropriate.

## 2011-01-21 ENCOUNTER — Encounter: Payer: Self-pay | Admitting: Internal Medicine

## 2011-03-12 ENCOUNTER — Telehealth: Payer: Self-pay | Admitting: *Deleted

## 2011-03-12 MED ORDER — CYCLOBENZAPRINE HCL 10 MG PO TABS: 10.0000 mg | ORAL_TABLET | Freq: Three times a day (TID) | ORAL | Status: AC | PRN

## 2011-03-12 NOTE — Telephone Encounter (Signed)
Pt bent over and felt like she pulled a muscle in her right side of back.  Would like a RX for Flexeril sent to [facility].

## 2011-03-12 NOTE — Telephone Encounter (Signed)
Per dr Lovell Sheehan- may have flexeril 10 tid #30 1 tid with 1 refill

## 2011-04-09 ENCOUNTER — Other Ambulatory Visit: Payer: Self-pay | Admitting: Internal Medicine

## 2011-07-27 ENCOUNTER — Encounter: Payer: Self-pay | Admitting: Internal Medicine

## 2011-07-27 ENCOUNTER — Ambulatory Visit (INDEPENDENT_AMBULATORY_CARE_PROVIDER_SITE_OTHER): Payer: BC Managed Care – PPO | Admitting: Internal Medicine

## 2011-07-27 VITALS — BP 110/70 | HR 72 | Temp 98.2°F | Resp 16 | Ht 64.0 in | Wt 130.0 lb

## 2011-07-27 DIAGNOSIS — G4700 Insomnia, unspecified: Secondary | ICD-10-CM

## 2011-07-27 DIAGNOSIS — Z2911 Encounter for prophylactic immunotherapy for respiratory syncytial virus (RSV): Secondary | ICD-10-CM

## 2011-07-27 DIAGNOSIS — I10 Essential (primary) hypertension: Secondary | ICD-10-CM

## 2011-07-27 DIAGNOSIS — L570 Actinic keratosis: Secondary | ICD-10-CM

## 2011-07-27 DIAGNOSIS — R51 Headache: Secondary | ICD-10-CM

## 2011-07-27 DIAGNOSIS — M549 Dorsalgia, unspecified: Secondary | ICD-10-CM

## 2011-07-27 DIAGNOSIS — Z0000 Encounter for general adult medical examination without abnormal findings: Secondary | ICD-10-CM

## 2011-07-27 MED ORDER — ESTAZOLAM 1 MG PO TABS: 1.0000 mg | ORAL_TABLET | Freq: Every day | ORAL | Status: DC

## 2011-07-27 MED ORDER — MELOXICAM 15 MG PO TABS: 15.0000 mg | ORAL_TABLET | Freq: Every day | ORAL | Status: DC | PRN

## 2011-07-27 NOTE — Progress Notes (Signed)
Subjective:    Patient ID: Sandy Gill, female    DOB: 04-04-1945, 66 y.o.   MRN: [id number]  HPI  Slight increase in frequency of migraines. Atypical pattern. Uses tylenol head ache. Suggests "excedrine" Primary complaint is insomnia Has actinic keratosis on nose   Review of Systems  Constitutional: Negative for activity change, appetite change and fatigue.  HENT: Negative for ear pain, congestion, neck pain, postnasal drip and sinus pressure.   Eyes: Negative for redness and visual disturbance.  Respiratory: Negative for cough, shortness of breath and wheezing.   Gastrointestinal: Negative for abdominal pain and abdominal distention.  Genitourinary: Negative for dysuria, frequency and menstrual problem.  Musculoskeletal: Negative for myalgias, joint swelling and arthralgias.  Skin: Negative for rash and wound.  Neurological: Negative for dizziness, weakness and headaches.  Hematological: Negative for adenopathy. Does not bruise/bleed easily.  Psychiatric/Behavioral: Negative for disturbed wake/sleep cycle and decreased concentration.    The patient is instructed to continue all medications as prescribed. Schedule followup with check out clerk upon leaving the clinic Past Medical History  Diagnosis Date  . Hypertension   . Headache     History   Social History  . Marital Status: Married    Spouse Name: N/A    Number of Children: N/A  . Years of Education: N/A   Occupational History  . Not on file.   Social History Main Topics  . Smoking status: Never Smoker   . Smokeless tobacco: Not on file  . Alcohol Use: No  . Drug Use: No  . Sexually Active: Not on file   Other Topics Concern  . Not on file   Social History Narrative  . No narrative on file    No past surgical history on file.  Family History  Problem Relation Age of Onset  . Hypertension    . Coronary artery disease      Allergies  Allergen Reactions  . Penicillins   . Sulfonamide  Derivatives     Current Outpatient Prescriptions on File Prior to Visit  Medication Sig Dispense Refill  . aspirin 325 MG tablet Take 325 mg by mouth daily.        . bisoprolol-hydrochlorothiazide (ZIAC) 5-6.25 MG per tablet TAKE ONE TABLET BY MOUTH TWICE DAILY  180 tablet  3  . NASONEX 50 MCG/ACT nasal spray USE 2 SPRAYS IN EACH NOSTRIL EVERY DAY  17 g  11  . DISCONTD: meloxicam (MOBIC) 15 MG tablet Take 1 tablet (15 mg total) by mouth daily as needed.  30 tablet  2    BP 110/70  Pulse 72  Temp 98.2 F (36.8 C)  Resp 16  Ht 5\' 4"  (1.626 m)  Wt 130 lb (58.968 kg)  BMI 22.31 kg/m2       Objective:   Physical Exam  Nursing note and vitals reviewed. Constitutional: She is oriented to person, place, and time. She appears well-developed and well-nourished. No distress.  HENT:  Head: Normocephalic and atraumatic.  Right Ear: External ear normal.  Left Ear: External ear normal.  Nose: Nose normal.  Mouth/Throat: Oropharynx is clear and moist.  Eyes: Conjunctivae and EOM are normal. Pupils are equal, round, and reactive to light.  Neck: Normal range of motion. Neck supple. No JVD present. No tracheal deviation present. No thyromegaly present.  Cardiovascular: Normal rate, regular rhythm, normal heart sounds and intact distal pulses.   No murmur heard. Pulmonary/Chest: Effort normal and breath sounds normal. She has no wheezes. She exhibits no tenderness.  Abdominal: Soft. Bowel sounds are normal.  Musculoskeletal: Normal range of motion. She exhibits no edema and no tenderness.  Lymphadenopathy:    She has no cervical adenopathy.  Neurological: She is alert and oriented to person, place, and time. She has normal reflexes. No cranial nerve deficit.  Skin: Skin is warm and dry. She is not diaphoretic.  Psychiatric: She has a normal mood and affect. Her behavior is normal.          Assessment & Plan:  Atypical migraines Discussed the pattern and the use of excedrine treat  AK on nose with cryotherapy  Informed consent was obtained in the lesion on the right nare was treated for 60 seconds of liquid nitrogen application the patient tolerated the procedure well as procedural care was discussed with the patient and instructions should the lesion reappears contact our office immediately  Insomnia has been trying melatonin

## 2011-07-27 NOTE — Patient Instructions (Signed)
The patient is instructed to continue all medications as prescribed. Schedule followup with check out clerk upon leaving the clinic  

## 2011-11-26 ENCOUNTER — Ambulatory Visit: Payer: BC Managed Care – PPO | Admitting: Internal Medicine

## 2011-11-28 ENCOUNTER — Other Ambulatory Visit: Payer: Self-pay | Admitting: Internal Medicine

## 2011-12-24 ENCOUNTER — Ambulatory Visit: Payer: Self-pay | Admitting: Internal Medicine

## 2012-02-29 ENCOUNTER — Encounter: Payer: Self-pay | Admitting: Internal Medicine

## 2012-02-29 ENCOUNTER — Ambulatory Visit (INDEPENDENT_AMBULATORY_CARE_PROVIDER_SITE_OTHER): Payer: 59 | Admitting: Internal Medicine

## 2012-02-29 VITALS — BP 130/80 | HR 72 | Temp 98.2°F | Resp 16 | Ht 64.0 in | Wt 130.0 lb

## 2012-02-29 DIAGNOSIS — F439 Reaction to severe stress, unspecified: Secondary | ICD-10-CM | POA: Insufficient documentation

## 2012-02-29 DIAGNOSIS — B079 Viral wart, unspecified: Secondary | ICD-10-CM

## 2012-02-29 DIAGNOSIS — I10 Essential (primary) hypertension: Secondary | ICD-10-CM

## 2012-02-29 DIAGNOSIS — Z733 Stress, not elsewhere classified: Secondary | ICD-10-CM

## 2012-02-29 DIAGNOSIS — E7800 Pure hypercholesterolemia, unspecified: Secondary | ICD-10-CM

## 2012-02-29 MED ORDER — BISOPROLOL-HYDROCHLOROTHIAZIDE 5-6.25 MG PO TABS: 1.0000 | ORAL_TABLET | Freq: Every day | ORAL | Status: DC

## 2012-02-29 MED ORDER — MOMETASONE FUROATE 50 MCG/ACT NA SUSP: 2.0000 | Freq: Every day | NASAL | Status: DC

## 2012-02-29 NOTE — Progress Notes (Signed)
Subjective:    Patient ID: Sandy Gill, female    DOB: [birth year], 67 y.o.   MRN: [id number]  HPI folow up for weight loss and HTN Wart on back   Review of Systems  Constitutional: Negative for activity change, appetite change and fatigue.  HENT: Negative for ear pain, congestion, neck pain, postnasal drip and sinus pressure.   Eyes: Negative for redness and visual disturbance.  Respiratory: Negative for cough, shortness of breath and wheezing.   Gastrointestinal: Negative for abdominal pain and abdominal distention.  Genitourinary: Negative for dysuria, frequency and menstrual problem.  Musculoskeletal: Negative for myalgias, joint swelling and arthralgias.  Skin: Positive for color change and pallor. Negative for rash and wound.  Neurological: Positive for weakness. Negative for dizziness and headaches.  Hematological: Negative for adenopathy. Does not bruise/bleed easily.  Psychiatric/Behavioral: Negative for sleep disturbance and decreased concentration.   Past Medical History  Diagnosis Date  . Hypertension   . Headache     History   Social History  . Marital Status: Married    Spouse Name: N/A    Number of Children: N/A  . Years of Education: N/A   Occupational History  . Not on file.   Social History Main Topics  . Smoking status: Never Smoker   . Smokeless tobacco: Not on file  . Alcohol Use: No  . Drug Use: No  . Sexually Active: Not on file   Other Topics Concern  . Not on file   Social History Narrative  . No narrative on file    No past surgical history on file.  Family History  Problem Relation Age of Onset  . Hypertension    . Coronary artery disease      Allergies  Allergen Reactions  . Penicillins   . Sulfonamide Derivatives     Current Outpatient Prescriptions on File Prior to Visit  Medication Sig Dispense Refill  . aspirin 325 MG tablet Take 325 mg by mouth daily.        . bisoprolol-hydrochlorothiazide (ZIAC) 5-6.25 MG  per tablet Take 1 tablet by mouth daily.  90 tablet  3  . meloxicam (MOBIC) 15 MG tablet Take 1 tablet (15 mg total) by mouth daily as needed.  30 tablet  5  . mometasone (NASONEX) 50 MCG/ACT nasal spray Place 2 sprays into the nose daily.  17 g  11    BP 130/80  Pulse 72  Temp 98.2 F (36.8 C)  Resp 16  Ht 5\' 4"  (1.626 m)  Wt 130 lb (58.968 kg)  BMI 22.31 kg/m2       Objective:   Physical Exam  Nursing note and vitals reviewed. Constitutional: She is oriented to person, place, and time. She appears well-developed and well-nourished. No distress.  HENT:  Head: Normocephalic and atraumatic.  Right Ear: External ear normal.  Left Ear: External ear normal.  Nose: Nose normal.  Mouth/Throat: Oropharynx is clear and moist.  Eyes: Conjunctivae normal and EOM are normal. Pupils are equal, round, and reactive to light.  Neck: Normal range of motion. Neck supple. No JVD present. No tracheal deviation present. No thyromegaly present.  Cardiovascular: Normal rate and regular rhythm.   No murmur heard. Pulmonary/Chest: Effort normal and breath sounds normal. She has no wheezes. She exhibits no tenderness.  Abdominal: Soft. Bowel sounds are normal.  Musculoskeletal: Normal range of motion. She exhibits no edema and no tenderness.  Lymphadenopathy:    She has no cervical adenopathy.  Neurological: She is alert  and oriented to person, place, and time. She has normal reflexes. No cranial nerve deficit.  Skin: Rash noted. She is not diaphoretic.          Assessment & Plan:  Wart on back Cryo therapy Blood pressure good Weight loss discussed  Informed consent was obtained in the lesion was treated for 60 seconds of liquid nitrogen application the patient tolerated the procedure well as procedural care was discussed with the patient and instructions should the lesion reappears contact our office immediately

## 2012-02-29 NOTE — Patient Instructions (Signed)
The patient is instructed to continue all medications as prescribed. Schedule followup with check out clerk upon leaving the clinic  

## 2012-03-10 ENCOUNTER — Other Ambulatory Visit: Payer: Self-pay | Admitting: *Deleted

## 2012-03-10 MED ORDER — BISOPROLOL-HYDROCHLOROTHIAZIDE 5-6.25 MG PO TABS: 1.0000 | ORAL_TABLET | Freq: Two times a day (BID) | ORAL | Status: DC

## 2012-05-13 ENCOUNTER — Telehealth: Payer: Self-pay | Admitting: Internal Medicine

## 2012-05-13 NOTE — Telephone Encounter (Signed)
Pt take generic ziac twice a day not ONCE a day. [facility]

## 2012-05-13 NOTE — Telephone Encounter (Signed)
Had already been called in

## 2012-08-19 ENCOUNTER — Ambulatory Visit (INDEPENDENT_AMBULATORY_CARE_PROVIDER_SITE_OTHER): Payer: 59 | Admitting: Family Medicine

## 2012-08-19 ENCOUNTER — Encounter: Payer: Self-pay | Admitting: Family Medicine

## 2012-08-19 VITALS — BP 122/78 | HR 62 | Temp 98.0°F | Wt 130.0 lb

## 2012-08-19 DIAGNOSIS — K429 Umbilical hernia without obstruction or gangrene: Secondary | ICD-10-CM

## 2012-08-19 NOTE — Patient Instructions (Addendum)
I think you have a very small umbilical hernia. Follow up for any increasing pain, firmness, redness, or warmth.

## 2012-08-19 NOTE — Progress Notes (Signed)
  Subjective:    Patient ID: Sandy Gill, female    DOB: Jan 22, 1946, 67 y.o.   MRN: [id number]  HPI Patient seen with 3 week history of some intermittent very mild periumbilical pain First noted after some straining. No history of hernia. Denies any nausea or vomiting. No bowel changes. Colonoscopy up to date. States very active with gardening and does lots of lifting Currently asymptomatic.  No fever or chills.  Past Medical History  Diagnosis Date  . Hypertension   . Headache(784.0)    No past surgical history on file.  reports that she has never smoked. She does not have any smokeless tobacco history on file. She reports that she does not drink alcohol or use illicit drugs. family history includes Coronary artery disease in an unspecified family member and Hypertension in an unspecified family member. Allergies  Allergen Reactions  . Penicillins   . Sulfonamide Derivatives       Review of Systems  Constitutional: Negative for fever, chills, appetite change and unexpected weight change.  Gastrointestinal: Positive for abdominal pain. Negative for nausea, vomiting, diarrhea, constipation and abdominal distention.       Objective:   Physical Exam  Constitutional: She appears well-developed and well-nourished.  Cardiovascular: Normal rate and regular rhythm.   Pulmonary/Chest: Effort normal and breath sounds normal. No respiratory distress. She has no wheezes. She has no rales.  Abdominal: Soft. She exhibits no distension. There is no tenderness.  Patient has very small palpated defect umbilical area but no definite hernia mass          Assessment & Plan:  Probable very small umbilical hernia. At this point she is not interested in surgical referral. If this becomes more symptomatic set up with general surgeon

## 2012-08-22 ENCOUNTER — Encounter: Payer: 59 | Admitting: Internal Medicine

## 2012-08-31 ENCOUNTER — Encounter: Payer: Self-pay | Admitting: Internal Medicine

## 2012-08-31 ENCOUNTER — Ambulatory Visit (INDEPENDENT_AMBULATORY_CARE_PROVIDER_SITE_OTHER): Payer: 59 | Admitting: Internal Medicine

## 2012-08-31 VITALS — BP 144/80 | HR 72 | Temp 98.2°F | Resp 16 | Ht <= 58 in | Wt 128.0 lb

## 2012-08-31 DIAGNOSIS — I10 Essential (primary) hypertension: Secondary | ICD-10-CM

## 2012-08-31 DIAGNOSIS — Z0000 Encounter for general adult medical examination without abnormal findings: Secondary | ICD-10-CM

## 2012-08-31 DIAGNOSIS — F329 Major depressive disorder, single episode, unspecified: Secondary | ICD-10-CM

## 2012-08-31 DIAGNOSIS — R51 Headache: Secondary | ICD-10-CM

## 2012-08-31 DIAGNOSIS — F4321 Adjustment disorder with depressed mood: Secondary | ICD-10-CM

## 2012-08-31 LAB — HEPATIC FUNCTION PANEL
ALT: 19 U/L (ref 0–35)
Albumin: 4.4 g/dL (ref 3.5–5.2)
Alkaline Phosphatase: 88 U/L (ref 39–117)
Total Protein: 7.6 g/dL (ref 6.0–8.3)

## 2012-08-31 LAB — LIPID PANEL
Cholesterol: 179 mg/dL (ref 0–200)
LDL Cholesterol: 107 mg/dL — ABNORMAL HIGH (ref 0–99)
Triglycerides: 131 mg/dL (ref 0.0–149.0)
VLDL: 26.2 mg/dL (ref 0.0–40.0)

## 2012-08-31 LAB — POCT URINALYSIS DIPSTICK
Blood, UA: NEGATIVE
Glucose, UA: NEGATIVE
Nitrite, UA: NEGATIVE
Urobilinogen, UA: 0.2

## 2012-08-31 LAB — CBC WITH DIFFERENTIAL/PLATELET
Basophils Absolute: 0 10*3/uL (ref 0.0–0.1)
Hemoglobin: 14.3 g/dL (ref 12.0–15.0)
Lymphocytes Relative: 11.9 % — ABNORMAL LOW (ref 12.0–46.0)
Monocytes Relative: 7 % (ref 3.0–12.0)
Neutro Abs: 7 10*3/uL (ref 1.4–7.7)
Neutrophils Relative %: 79 % — ABNORMAL HIGH (ref 43.0–77.0)
RBC: 5.19 Mil/uL — ABNORMAL HIGH (ref 3.87–5.11)
RDW: 14.1 % (ref 11.5–14.6)

## 2012-08-31 LAB — BASIC METABOLIC PANEL
Chloride: 101 mEq/L (ref 96–112)
Potassium: 3.9 mEq/L (ref 3.5–5.1)
Sodium: 137 mEq/L (ref 135–145)

## 2012-08-31 LAB — TSH: TSH: 0.69 u[IU]/mL (ref 0.35–5.50)

## 2012-08-31 MED ORDER — MELOXICAM 15 MG PO TABS: 15.0000 mg | ORAL_TABLET | Freq: Every day | ORAL | Status: DC

## 2012-08-31 MED ORDER — CITALOPRAM HYDROBROMIDE 20 MG PO TABS: 20.0000 mg | ORAL_TABLET | Freq: Every day | ORAL | Status: DC

## 2012-08-31 NOTE — Progress Notes (Signed)
Subjective:    Patient ID: Sandy Gill, female    DOB: [birth year], 67 y.o.   MRN: [id number]  HPI Medicare welness   Review of Systems  Constitutional: Negative for activity change, appetite change and fatigue.  HENT: Negative for ear pain, congestion, neck pain, postnasal drip and sinus pressure.   Eyes: Negative for redness and visual disturbance.  Respiratory: Negative for cough, shortness of breath and wheezing.   Gastrointestinal: Negative for abdominal pain and abdominal distention.  Genitourinary: Negative for dysuria, frequency and menstrual problem.  Musculoskeletal: Negative for myalgias, joint swelling and arthralgias.  Skin: Negative for rash and wound.  Neurological: Negative for dizziness, weakness and headaches.  Hematological: Negative for adenopathy. Does not bruise/bleed easily.  Psychiatric/Behavioral: Positive for sleep disturbance and agitation. Negative for decreased concentration. The patient is nervous/anxious.    Past Medical History  Diagnosis Date  . Hypertension   . Headache(784.0)     History   Social History  . Marital Status: Married    Spouse Name: N/A    Number of Children: N/A  . Years of Education: N/A   Occupational History  . Not on file.   Social History Main Topics  . Smoking status: Never Smoker   . Smokeless tobacco: Not on file  . Alcohol Use: No  . Drug Use: No  . Sexually Active: Not on file   Other Topics Concern  . Not on file   Social History Narrative  . No narrative on file    History reviewed. No pertinent past surgical history.  Family History  Problem Relation Age of Onset  . Hypertension    . Coronary artery disease      Allergies  Allergen Reactions  . Penicillins   . Sulfonamide Derivatives     Current Outpatient Prescriptions on File Prior to Visit  Medication Sig Dispense Refill  . bisoprolol-hydrochlorothiazide (ZIAC) 5-6.25 MG per tablet Take 1 tablet by mouth 2 (two) times daily.  180  tablet  3  . mometasone (NASONEX) 50 MCG/ACT nasal spray Place 2 sprays into the nose daily.  17 g  11   No current facility-administered medications on file prior to visit.    BP 144/80  Pulse 72  Temp(Src) 98.2 F (36.8 C)  Resp 16  Ht 4' (1.219 m)  Wt 128 lb (58.06 kg)  BMI 39.07 kg/m2       Objective:   Physical Exam  Nursing note and vitals reviewed. Constitutional: She is oriented to person, place, and time. She appears well-developed and well-nourished. No distress.  HENT:  Head: Normocephalic and atraumatic.  Right Ear: External ear normal.  Left Ear: External ear normal.  Nose: Nose normal.  Mouth/Throat: Oropharynx is clear and moist.  Eyes: Conjunctivae and EOM are normal. Pupils are equal, round, and reactive to light.  Neck: Normal range of motion. Neck supple. No JVD present. No tracheal deviation present. No thyromegaly present.  Cardiovascular: Normal rate, regular rhythm, normal heart sounds and intact distal pulses.   No murmur heard. Pulmonary/Chest: Effort normal and breath sounds normal. She has no wheezes. She exhibits no tenderness.  Abdominal: Soft. Bowel sounds are normal.  Musculoskeletal: Normal range of motion. She exhibits no edema and no tenderness.  Lymphadenopathy:    She has no cervical adenopathy.  Neurological: She is alert and oriented to person, place, and time. She has normal reflexes. No cranial nerve deficit.  Skin: Skin is warm and dry. She is not diaphoretic.  Psychiatric: She has  a normal mood and affect. Her behavior is normal.          Assessment & Plan:

## 2012-09-07 ENCOUNTER — Other Ambulatory Visit: Payer: Self-pay

## 2012-10-12 ENCOUNTER — Other Ambulatory Visit: Payer: Self-pay | Admitting: *Deleted

## 2012-12-08 ENCOUNTER — Other Ambulatory Visit: Payer: Self-pay

## 2012-12-26 ENCOUNTER — Other Ambulatory Visit: Payer: Self-pay | Admitting: *Deleted

## 2012-12-26 MED ORDER — BISOPROLOL-HYDROCHLOROTHIAZIDE 5-6.25 MG PO TABS: 1.0000 | ORAL_TABLET | Freq: Two times a day (BID) | ORAL | Status: DC

## 2013-01-13 ENCOUNTER — Encounter: Payer: Self-pay | Admitting: Internal Medicine

## 2013-01-13 ENCOUNTER — Ambulatory Visit (INDEPENDENT_AMBULATORY_CARE_PROVIDER_SITE_OTHER): Payer: 59 | Admitting: Internal Medicine

## 2013-01-13 VITALS — BP 136/82 | HR 76 | Temp 98.1°F | Resp 16 | Ht 64.0 in | Wt 128.0 lb

## 2013-01-13 DIAGNOSIS — I10 Essential (primary) hypertension: Secondary | ICD-10-CM

## 2013-01-13 NOTE — Progress Notes (Signed)
Pre visit review using our clinic review tool, if applicable. No additional management support is needed unless otherwise documented below in the visit note. 

## 2013-01-13 NOTE — Progress Notes (Signed)
Subjective:    Patient ID: Sandy Gill, female    DOB: Jun 17, 1945, 67 y.o.   MRN: [id number]  HPI Increased stress dealing with husband Head aches stable Blood pressure stable   Review of Systems  Constitutional: Negative for activity change, appetite change and fatigue.  HENT: Negative for congestion, ear pain, postnasal drip and sinus pressure.   Eyes: Negative for redness and visual disturbance.  Respiratory: Negative for cough, shortness of breath and wheezing.   Gastrointestinal: Negative for abdominal pain and abdominal distention.  Genitourinary: Negative for dysuria, frequency and menstrual problem.  Musculoskeletal: Negative for arthralgias, joint swelling, myalgias and neck pain.  Skin: Negative for rash and wound.  Neurological: Negative for dizziness, weakness and headaches.  Hematological: Negative for adenopathy. Does not bruise/bleed easily.  Psychiatric/Behavioral: Negative for sleep disturbance and decreased concentration.   Past Medical History  Diagnosis Date  . Hypertension   . Headache(784.0)     History   Social History  . Marital Status: Married    Spouse Name: N/A    Number of Children: N/A  . Years of Education: N/A   Occupational History  . Not on file.   Social History Main Topics  . Smoking status: Never Smoker   . Smokeless tobacco: Not on file  . Alcohol Use: No  . Drug Use: No  . Sexual Activity: Not on file   Other Topics Concern  . Not on file   Social History Narrative  . No narrative on file    History reviewed. No pertinent past surgical history.  Family History  Problem Relation Age of Onset  . Hypertension    . Coronary artery disease      Allergies  Allergen Reactions  . Penicillins   . Sulfonamide Derivatives     Current Outpatient Prescriptions on File Prior to Visit  Medication Sig Dispense Refill  . bisoprolol-hydrochlorothiazide (ZIAC) 5-6.25 MG per tablet Take 1 tablet by mouth 2 (two) times  daily.  180 tablet  3  . meloxicam (MOBIC) 15 MG tablet Take 1 tablet (15 mg total) by mouth daily.  90 tablet  3  . mometasone (NASONEX) 50 MCG/ACT nasal spray Place 2 sprays into the nose daily.  17 g  11   No current facility-administered medications on file prior to visit.    BP 136/82  Pulse 76  Temp(Src) 98.1 F (36.7 C)  Resp 16  Ht 5\' 4"  (1.626 m)  Wt 128 lb (58.06 kg)  BMI 21.96 kg/m2        Objective:   Physical Exam  Constitutional: She is oriented to person, place, and time. She appears well-developed and well-nourished. No distress.  HENT:  Head: Normocephalic and atraumatic.  Eyes: Conjunctivae and EOM are normal. Pupils are equal, round, and reactive to light.  Neck: Normal range of motion. Neck supple. No JVD present. No tracheal deviation present. No thyromegaly present.  Cardiovascular: Normal rate and regular rhythm.   No murmur heard. Pulmonary/Chest: Effort normal and breath sounds normal. She has no wheezes. She exhibits no tenderness.  Abdominal: Soft. Bowel sounds are normal.  Musculoskeletal: Normal range of motion. She exhibits no edema and no tenderness.  Lymphadenopathy:    She has no cervical adenopathy.  Neurological: She is alert and oriented to person, place, and time. She has normal reflexes. No cranial nerve deficit.  Skin: Skin is warm and dry. She is not diaphoretic.  Psychiatric: She has a normal mood and affect. Her behavior is normal.  Assessment & Plan:  HTN Stable HA stable Husbands health  Stress celexa made her ill Sister is on prozac

## 2013-03-28 ENCOUNTER — Telehealth: Payer: Self-pay | Admitting: Internal Medicine

## 2013-03-28 MED ORDER — MELOXICAM 15 MG PO TABS: 15.0000 mg | ORAL_TABLET | Freq: Every day | ORAL | Status: DC

## 2013-03-28 MED ORDER — MOMETASONE FUROATE 50 MCG/ACT NA SUSP: 2.0000 | Freq: Every day | NASAL | Status: DC

## 2013-03-28 NOTE — Telephone Encounter (Signed)
Patient requesting refills of the following to be sent to [facility]:  mometasone (NASONEX) 50 MCG/ACT nasal spray meloxicam (MOBIC) 15 MG tablet

## 2013-03-28 NOTE — Telephone Encounter (Signed)
Refills sent in electronically

## 2013-05-29 ENCOUNTER — Encounter: Payer: Self-pay | Admitting: Internal Medicine

## 2013-05-29 ENCOUNTER — Ambulatory Visit (INDEPENDENT_AMBULATORY_CARE_PROVIDER_SITE_OTHER): Payer: 59 | Admitting: Internal Medicine

## 2013-05-29 VITALS — BP 150/90 | HR 80 | Temp 98.1°F | Ht 63.0 in | Wt 132.0 lb

## 2013-05-29 DIAGNOSIS — L719 Rosacea, unspecified: Secondary | ICD-10-CM

## 2013-05-29 DIAGNOSIS — I10 Essential (primary) hypertension: Secondary | ICD-10-CM

## 2013-05-29 MED ORDER — ATENOLOL-CHLORTHALIDONE 50-25 MG PO TABS: 1.0000 | ORAL_TABLET | Freq: Every day | ORAL | Status: DC

## 2013-05-29 NOTE — Progress Notes (Signed)
Pre visit review using our clinic review tool, if applicable. No additional management support is needed unless otherwise documented below in the visit note. 

## 2013-05-29 NOTE — Patient Instructions (Addendum)
Follow up with mat for weight and blood pressure   Needed to find time in her schedule for one hour of exercise at a place like [facility] [employer] twice a week and to help control stressed the need to have at least one hour a week that [place] can [place] [facility] [employer] or go to the park and sit by yourself and relax

## 2013-05-29 NOTE — Progress Notes (Signed)
   Subjective:    Patient ID: Sandy Gill, female    DOB: 12/15/1945, 68 y.o.   MRN: [id number]  Hypertension   Increased weight and elevated blood pressure and stress On BB  Goal weight of 125 Increased head ache      Review of Systems  Constitutional: Negative.   HENT: Negative.   Eyes: Negative.   Cardiovascular: Negative.   Gastrointestinal: Negative.   Genitourinary: Negative.   Neurological: Negative.        Objective:   Physical Exam  Constitutional: She is oriented to person, place, and time. She appears well-developed and well-nourished.  HENT:  Head: Normocephalic and atraumatic.  Eyes: Conjunctivae are normal. Pupils are equal, round, and reactive to light.  Cardiovascular: Regular rhythm.   Abdominal: Soft. Bowel sounds are normal.  Neurological: She is alert and oriented to person, place, and time.          Assessment & Plan:  Change to atenolol and chlorithalidone for HTN and head aches

## 2013-05-30 ENCOUNTER — Telehealth: Payer: Self-pay | Admitting: Internal Medicine

## 2013-05-30 NOTE — Telephone Encounter (Signed)
Relevant patient education assigned to patient using Emmi. ° °

## 2013-06-12 ENCOUNTER — Telehealth: Payer: Self-pay | Admitting: Internal Medicine

## 2013-06-12 MED ORDER — CARVEDILOL 6.25 MG PO TABS
6.2500 mg | ORAL_TABLET | Freq: Two times a day (BID) | ORAL | Status: DC
Start: 2013-06-12 — End: 2014-06-20

## 2013-06-12 MED ORDER — BISOPROLOL-HYDROCHLOROTHIAZIDE 5-6.25 MG PO TABS: 1.0000 | ORAL_TABLET | Freq: Every day | ORAL | Status: DC

## 2013-06-12 NOTE — Telephone Encounter (Signed)
Pt states that her bp medication is not working for her, states it makes her heart beat differently, dr. Lovell SheehanJenkins had changed her to atenolol-chlorthalidone (TENORETIC 50) 50-25 MG per tablet pt needs to know what other options she has.

## 2013-06-12 NOTE — Telephone Encounter (Signed)
Left message for pt to call back  °

## 2013-06-12 NOTE — Telephone Encounter (Signed)
Pt stated she was already on ziac and her it was not effective for her BPs.  Per Dr Lovell SheehanJenkins change to coreg 6.25 mg bid #60 x1, called [facility] to cancel the ziac rx and sent coreg electronically, pt aware

## 2013-06-12 NOTE — Telephone Encounter (Signed)
Change to ziac 5/6.25

## 2013-07-22 ENCOUNTER — Other Ambulatory Visit: Payer: Self-pay | Admitting: Internal Medicine

## 2013-10-24 ENCOUNTER — Ambulatory Visit (INDEPENDENT_AMBULATORY_CARE_PROVIDER_SITE_OTHER): Payer: Commercial Managed Care - HMO | Admitting: Family

## 2013-10-24 ENCOUNTER — Telehealth: Payer: Self-pay | Admitting: Family Medicine

## 2013-10-24 ENCOUNTER — Encounter: Payer: Self-pay | Admitting: Family

## 2013-10-24 VITALS — BP 147/80 | HR 67 | Temp 98.1°F | Ht 63.0 in | Wt 131.4 lb

## 2013-10-24 DIAGNOSIS — H6121 Impacted cerumen, right ear: Secondary | ICD-10-CM

## 2013-10-24 DIAGNOSIS — H6120 Impacted cerumen, unspecified ear: Secondary | ICD-10-CM

## 2013-10-24 NOTE — Progress Notes (Signed)
   Subjective:    Patient ID: Sandy Gill, female    DOB: 1945-06-04, 68 y.o.   MRN: [id number]  Ear Fullness  There is pain in the right ear. This is a new problem. The current episode started in the past 7 days. The problem occurs constantly. The problem has been unchanged. There has been no fever. The pain is at a severity of 8/10. The pain is moderate. Associated symptoms include hearing loss. Pertinent negatives include no coughing, drainage, ear discharge or headaches. She has tried ear drops for the symptoms. The treatment provided mild relief.      Review of Systems  Constitutional: Negative.   HENT: Positive for hearing loss. Negative for ear discharge.   Eyes: Negative.   Respiratory: Negative.  Negative for cough and shortness of breath.   Cardiovascular: Negative.  Negative for palpitations.  Gastrointestinal: Negative.   Endocrine: Negative.   Genitourinary: Negative.   Musculoskeletal: Negative.   Neurological: Positive for dizziness and numbness. Negative for headaches.  Hematological: Negative.   Psychiatric/Behavioral: Negative.   All other systems reviewed and are negative.      Objective:   Physical Exam  Vitals reviewed. Constitutional: She is oriented to person, place, and time. She appears well-developed and well-nourished. No distress.  HENT:  Head: Normocephalic and atraumatic.  Left Ear: External ear normal.  Nose: Nose normal.  Mouth/Throat: Oropharynx is clear and moist.  Cerumen Impaction    Eyes: Pupils are equal, round, and reactive to light.  Neck: Normal range of motion. Neck supple. No thyromegaly present.  Cardiovascular: Normal rate, regular rhythm, normal heart sounds and intact distal pulses.   No murmur heard. Pulmonary/Chest: Effort normal and breath sounds normal. No respiratory distress. She has no wheezes.  Abdominal: Soft. Bowel sounds are normal. She exhibits no distension. There is no tenderness.  Musculoskeletal: Normal  range of motion. She exhibits no edema and no tenderness.  Neurological: She is alert and oriented to person, place, and time. She has normal reflexes. No cranial nerve deficit.  Skin: Skin is warm and dry.  Psychiatric: She has a normal mood and affect. Her behavior is normal. Judgment and thought content normal.      BP 147/80  Pulse 67  Temp(Src) 98.1 F (36.7 C) (Oral)  Ht  (1.6 m)  Wt 131 lb 6.4 oz (59.603 kg)  BMI 23.28 kg/m2     Assessment & Plan:  1. Cerumen impaction, right -May need to do ear drops prn -Keep ears clean and dry -RTO prn  Jannifer Rodney, FNP

## 2013-10-24 NOTE — Patient Instructions (Signed)
Cerumen Impaction °A cerumen impaction is when the wax in your ear forms a plug. This plug usually causes reduced hearing. Sometimes it also causes an earache or dizziness. Removing a cerumen impaction can be difficult and painful. The wax sticks to the ear canal. The canal is sensitive and bleeds easily. If you try to remove a heavy wax buildup with a cotton tipped swab, you may push it in further. °Irrigation with water, suction, and small ear curettes may be used to clear out the wax. If the impaction is fixed to the skin in the ear canal, ear drops may be needed for a few days to loosen the wax. People who build up a lot of wax frequently can use ear wax removal products available in your local drugstore. °SEEK MEDICAL CARE IF:  °You develop an earache, increased hearing loss, or marked dizziness. °Document Released: 02/27/2004 Document Revised: 04/13/2011 Document Reviewed: 04/18/2009 °ExitCare® Patient Information ©2015 [employer]. This information is not intended to replace advice given to you by your health care provider. Make sure you discuss any questions you have with your health care provider. ° °

## 2013-10-24 NOTE — Telephone Encounter (Signed)
[employer] and is wanting to transfer to our practice. appt given for today with [facility] for her ear pain

## 2013-12-14 ENCOUNTER — Ambulatory Visit: Payer: 59 | Admitting: Family Medicine

## 2013-12-25 ENCOUNTER — Ambulatory Visit: Payer: 59 | Admitting: Family Medicine

## 2014-03-09 DIAGNOSIS — Z0100 Encounter for examination of eyes and vision without abnormal findings: Secondary | ICD-10-CM | POA: Diagnosis not present

## 2014-03-09 DIAGNOSIS — H5213 Myopia, bilateral: Secondary | ICD-10-CM | POA: Diagnosis not present

## 2014-03-12 DIAGNOSIS — H524 Presbyopia: Secondary | ICD-10-CM | POA: Diagnosis not present

## 2014-03-12 DIAGNOSIS — Z0100 Encounter for examination of eyes and vision without abnormal findings: Secondary | ICD-10-CM | POA: Diagnosis not present

## 2014-05-25 ENCOUNTER — Ambulatory Visit: Payer: Commercial Managed Care - HMO | Admitting: Family Medicine

## 2014-06-20 ENCOUNTER — Encounter: Payer: Self-pay | Admitting: Family Medicine

## 2014-06-20 ENCOUNTER — Ambulatory Visit (INDEPENDENT_AMBULATORY_CARE_PROVIDER_SITE_OTHER): Payer: Commercial Managed Care - HMO | Admitting: Family Medicine

## 2014-06-20 VITALS — BP 149/87 | HR 64 | Temp 97.7°F | Resp 16 | Ht 62.0 in | Wt 132.0 lb

## 2014-06-20 DIAGNOSIS — I10 Essential (primary) hypertension: Secondary | ICD-10-CM | POA: Diagnosis not present

## 2014-06-20 DIAGNOSIS — R002 Palpitations: Secondary | ICD-10-CM

## 2014-06-20 MED ORDER — BISOPROLOL-HYDROCHLOROTHIAZIDE 5-6.25 MG PO TABS: ORAL_TABLET | ORAL | Status: DC

## 2014-06-20 MED ORDER — MOMETASONE FUROATE 50 MCG/ACT NA SUSP: 2.0000 | Freq: Every day | NASAL | Status: DC

## 2014-06-20 MED ORDER — MELOXICAM 15 MG PO TABS: 15.0000 mg | ORAL_TABLET | Freq: Every day | ORAL | Status: DC

## 2014-06-20 NOTE — Progress Notes (Signed)
Pre visit review using our clinic review tool, if applicable. No additional management support is needed unless otherwise documented below in the visit note. 

## 2014-06-20 NOTE — Progress Notes (Signed)
Office Note 06/20/2014  CC:  Chief Complaint  Patient presents with  . Establish Care  . Hypertension   HPI:  Sandy Gill is a 69 y.o. White female who is here to transfer/establish care. Patient's most recent primary MD: Dr. Darryll [facility]apersJohn Jenkins [facility] (retired from clinical medicine). Has GYN office, sees PA/NP there but it has been over 2 yrs. Old records in EPIC/[facility] EMR were reviewed prior to or during today's visit.  Last visit with Dr. Lovell SheehanJenkins approx 1 yr ago: reviewed that office note + telephone notes thereafter (regarding bp/palpitations troubles) and her most recent labs (see below).  Taking bisop/hctz combo.  Currently still hypertensive at home, primarily systolics to 160s, diastolics 80s.  HR 70s-80s. Has hx of intermittent palpitations that have been well controlled since being on bisop/hctz but were not controlled on coreg or atenolol.  So, her remaining issue is bp control.  No HAs, no SOB, no vision complaints, no CP.  Past Medical History  Diagnosis Date  . Hypertension   . Headache(784.0)   . Allergic rhinitis     Fall>spring  . Kidney stones     x 1 episode  . Migraines     when "younger"  . Osteoarthritis     Hands, feet, knees: takes meloxicam occas    History reviewed. No pertinent past surgical history.  Family History  Problem Relation Age of Onset  . Hypertension    . Coronary artery disease    . Arthritis Mother   . Hypertension Mother   . Arthritis Father   . Heart disease Father   . Hypertension Father   . AAA (abdominal aortic aneurysm) Father     age of death 1985  . Arthritis Sister   . Hypertension Sister     History   Social History  . Marital Status: Married    Spouse Name: N/A  . Number of Children: N/A  . Years of Education: N/A   Occupational History  . Not on file.   Social History Main Topics  . Smoking status: Never Smoker   . Smokeless tobacco: Not on file  . Alcohol Use: No  . Drug Use: No  .  Sexual Activity: Not on file   Other Topics Concern  . Not on file   Social History Narrative   Married, 1 daughter, 2 grandchildren (grand-daughter lives with her).      Occupation: Advertising account plannerinsurance agent   No T/A/Ds.    Outpatient Encounter Prescriptions as of 06/20/2014  Medication Sig  . bisoprolol-hydrochlorothiazide (ZIAC) 5-6.25 MG per tablet 2 tabs po qAM and 1 tab po qhs  . meloxicam (MOBIC) 15 MG tablet Take 1 tablet (15 mg total) by mouth daily.  . mometasone (NASONEX) 50 MCG/ACT nasal spray Place 2 sprays into the nose daily.  . [DISCONTINUED] bisoprolol-hydrochlorothiazide (ZIAC) 5-6.25 MG per tablet TAKE 1 TABLET BY MOUTH 2 (TWO) TIMES DAILY.  . [DISCONTINUED] meloxicam (MOBIC) 15 MG tablet Take 1 tablet (15 mg total) by mouth daily.  . [DISCONTINUED] mometasone (NASONEX) 50 MCG/ACT nasal spray Place 2 sprays into the nose daily.  . [DISCONTINUED] carvedilol (COREG) 6.25 MG tablet Take 1 tablet (6.25 mg total) by mouth 2 (two) times daily with a meal. (Patient not taking: Reported on 06/20/2014)   No facility-administered encounter medications on file as of 06/20/2014.    Allergies  Allergen Reactions  . Penicillins   . Sulfonamide Derivatives     ROS See HPI above  PE; Blood pressure 149/87,  pulse 64, temperature 97.7 F (36.5 C), temperature source Oral, resp. rate 16, height 5\' 2"  (1.575 m), weight 132 lb (59.875 kg), SpO2 100 %. Gen: Alert, well appearing.  Patient is oriented to person, place, time, and situation. ZOX:WRUEENT:Eyes: no injection, icteris, swelling, or exudate.  EOMI, PERRLA. Mouth: lips without lesion/swelling.  Oral mucosa pink and moist. Oropharynx without erythema, exudate, or swelling.  Neck - No masses or thyromegaly or limitation in range of motion CV: RRR, no m/r/g.   LUNGS: CTA bilat, nonlabored resps, good aeration in all lung fields. EXT: no clubbing, cyanosis, or edema.   Pertinent labs:  Lab Results  Component Value Date   WBC 8.9  08/31/2012   HGB 14.3 08/31/2012   HCT 43.0 08/31/2012   MCV 82.8 08/31/2012   PLT 238.0 08/31/2012     Chemistry      Component Value Date/Time   NA 137 08/31/2012 0936   K 3.9 08/31/2012 0936   CL 101 08/31/2012 0936   CO2 28 08/31/2012 0936   BUN 15 08/31/2012 0936   CREATININE 0.7 08/31/2012 0936      Component Value Date/Time   CALCIUM 9.4 08/31/2012 0936   ALKPHOS 88 08/31/2012 0936   AST 18 08/31/2012 0936   ALT 19 08/31/2012 0936   BILITOT 1.0 08/31/2012 0936     Lab Results  Component Value Date   TSH 0.69 08/31/2012   Lab Results  Component Value Date   CHOL 179 08/31/2012   HDL 46.10 08/31/2012   LDLCALC 107* 08/31/2012   TRIG 131.0 08/31/2012   CHOLHDL 4 08/31/2012     ASSESSMENT AND PLAN:   Transfer pt;  1) HTN, not well controlled.  Will increase bisoprolol/hctz 5/6.25 : she currently takes this 1 tab bid and I will have her increase to TWO tabs po qAM and continue 1 tab po qPM.  Continue home  bp and HR monitoring.  2) Palpitations: well controlled on current beta blocker (atenolol and coreg were not helpful for this per pt). Continue bisoprolol-as noted above in #1.  3) GYN: she has a GYN office in [place] and says she has not been there in 2 yrs or so. I advised her to call their office to arrange f/u for cervical ca and breast ca screening with them.  An After Visit Summary was printed and given to the patient.  Return in about 4 weeks (around 07/18/2014) for annual CPE (fasting).

## 2014-07-12 ENCOUNTER — Encounter: Payer: Self-pay | Admitting: Family Medicine

## 2014-07-12 ENCOUNTER — Ambulatory Visit (INDEPENDENT_AMBULATORY_CARE_PROVIDER_SITE_OTHER): Payer: Commercial Managed Care - HMO | Admitting: Family Medicine

## 2014-07-12 VITALS — BP 118/70 | HR 63 | Temp 97.6°F | Resp 16 | Ht 62.0 in | Wt 133.0 lb

## 2014-07-12 DIAGNOSIS — Z0000 Encounter for general adult medical examination without abnormal findings: Secondary | ICD-10-CM

## 2014-07-12 DIAGNOSIS — R7989 Other specified abnormal findings of blood chemistry: Secondary | ICD-10-CM

## 2014-07-12 DIAGNOSIS — Z13220 Encounter for screening for lipoid disorders: Secondary | ICD-10-CM | POA: Diagnosis not present

## 2014-07-12 DIAGNOSIS — Z23 Encounter for immunization: Secondary | ICD-10-CM

## 2014-07-12 DIAGNOSIS — M542 Cervicalgia: Secondary | ICD-10-CM | POA: Diagnosis not present

## 2014-07-12 DIAGNOSIS — I10 Essential (primary) hypertension: Secondary | ICD-10-CM | POA: Diagnosis not present

## 2014-07-12 LAB — COMPREHENSIVE METABOLIC PANEL
ALBUMIN: 4.6 g/dL (ref 3.5–5.2)
ALT: 18 U/L (ref 0–35)
AST: 19 U/L (ref 0–37)
Alkaline Phosphatase: 88 U/L (ref 39–117)
BUN: 18 mg/dL (ref 6–23)
CALCIUM: 9.6 mg/dL (ref 8.4–10.5)
CHLORIDE: 100 meq/L (ref 96–112)
CO2: 30 meq/L (ref 19–32)
CREATININE: 0.68 mg/dL (ref 0.40–1.20)
GFR: 91.48 mL/min (ref >60.00)
GLUCOSE: 113 mg/dL — AB (ref 70–99)
POTASSIUM: 4.1 meq/L (ref 3.5–5.1)
Sodium: 136 mEq/L (ref 135–145)
TOTAL PROTEIN: 7.2 g/dL (ref 6.0–8.3)
Total Bilirubin: 0.9 mg/dL (ref 0.2–1.2)

## 2014-07-12 LAB — LIPID PANEL
CHOL/HDL RATIO: 4
Cholesterol: 167 mg/dL (ref 0–200)
HDL: 39.4 mg/dL (ref >39.00)
NonHDL: 127.6
Triglycerides: 229 mg/dL — ABNORMAL HIGH (ref 0.0–149.0)
VLDL: 45.8 mg/dL — AB (ref 0.0–40.0)

## 2014-07-12 LAB — LDL CHOLESTEROL, DIRECT: Direct LDL: 90 mg/dL

## 2014-07-12 MED ORDER — CYCLOBENZAPRINE HCL 10 MG PO TABS: 10.0000 mg | ORAL_TABLET | Freq: Three times a day (TID) | ORAL | Status: DC | PRN

## 2014-07-12 NOTE — Progress Notes (Signed)
Pre visit review using our clinic review tool, if applicable. No additional management support is needed unless otherwise documented below in the visit note. 

## 2014-07-12 NOTE — Progress Notes (Signed)
Office Note 07/12/2014  CC:  Chief Complaint  Patient presents with  . Annual Exam    Pt is fasting.    HPI:  Sandy Gill is a 69 y.o. White female who is here for health maintenance exam, fasting. Feeling well except some days very tired.  She describes a hectic/busy life taking care of husband and working. No melena, hematochezia, gross hematuria, or vag bleeding. Last pap was 2 yrs ago. Has GYN, who she will be getting in to see soon and they'll get DEXA, mammogram, and do PAP.  She has occ neck pain at end of day, in the past has taken 1/4 of cyclobenzaprine hs in the past and in the morning the pain is gone and she has been able to sleep.  She asks for refill of this today.   Past Medical History  Diagnosis Date  . Hypertension   . Headache(784.0)   . Allergic rhinitis     Fall>spring  . Kidney stones     x 1 episode  . Migraines     when "younger"  . Osteoarthritis     Hands, feet, knees: takes meloxicam occas    Past Surgical History  Procedure Laterality Date  . Colonoscopy  08/02/06    Family History  Problem Relation Age of Onset  . Hypertension    . Coronary artery disease    . Arthritis Mother   . Hypertension Mother   . Arthritis Father   . Heart disease Father   . Hypertension Father   . AAA (abdominal aortic aneurysm) Father     age of death 60  . Arthritis Sister   . Hypertension Sister     History   Social History  . Marital Status: Married    Spouse Name: N/A  . Number of Children: N/A  . Years of Education: N/A   Occupational History  . Not on file.   Social History Main Topics  . Smoking status: Never Smoker   . Smokeless tobacco: Not on file  . Alcohol Use: No  . Drug Use: No  . Sexual Activity: Not on file   Other Topics Concern  . Not on file   Social History Narrative   Married, 1 daughter, 2 grandchildren (grand-daughter lives with her).      Occupation: Advertising account planner   No T/A/Ds.    Outpatient  Prescriptions Prior to Visit  Medication Sig Dispense Refill  . bisoprolol-hydrochlorothiazide (ZIAC) 5-6.25 MG per tablet 2 tabs po qAM and 1 tab po qhs 90 tablet 1  . meloxicam (MOBIC) 15 MG tablet Take 1 tablet (15 mg total) by mouth daily. 90 tablet 3  . mometasone (NASONEX) 50 MCG/ACT nasal spray Place 2 sprays into the nose daily. 51 g 3   No facility-administered medications prior to visit.    Allergies  Allergen Reactions  . Penicillins   . Sulfonamide Derivatives     ROS Review of Systems  Constitutional: Negative for fever, chills, appetite change and fatigue.  HENT: Negative for congestion, dental problem, ear pain and sore throat.   Eyes: Negative for discharge, redness and visual disturbance.  Respiratory: Negative for cough, chest tightness, shortness of breath and wheezing.   Cardiovascular: Negative for chest pain, palpitations and leg swelling.  Gastrointestinal: Negative for nausea, vomiting, abdominal pain, diarrhea and blood in stool.  Genitourinary: Negative for dysuria, urgency, frequency, hematuria, flank pain and difficulty urinating.  Musculoskeletal: Negative for myalgias, back pain, joint swelling, arthralgias and neck stiffness.  Skin: Negative for pallor and rash.  Neurological: Negative for dizziness, speech difficulty, weakness and headaches.  Hematological: Negative for adenopathy. Does not bruise/bleed easily.  Psychiatric/Behavioral: Negative for confusion and sleep disturbance. The patient is not nervous/anxious.     PE; Blood pressure 146/84, pulse 63, temperature 97.6 F (36.4 C), temperature source Oral, resp. rate 16, height  (1.575 m), weight 133 lb (60.328 kg), SpO2 100 %.  Pt examined with Wallace Keller, CMA, as chaperone. Gen: Alert, well appearing.  Patient is oriented to person, place, time, and situation. AFFECT: pleasant, lucid thought and speech. ENT: Ears: EACs clear, normal epithelium.  TMs with good light reflex and  landmarks bilaterally.  Eyes: no injection, icteris, swelling, or exudate.  EOMI, PERRLA. Nose: no drainage or turbinate edema/swelling.  No injection or focal lesion.  Mouth: lips without lesion/swelling.  Oral mucosa pink and moist.  Dentition intact and without obvious caries or gingival swelling.  Oropharynx without erythema, exudate, or swelling.  Neck: supple/nontender.  No LAD, mass, or TM.  Carotid pulses 2+ bilaterally, without bruits. CV: RRR, no m/r/g.   LUNGS: CTA bilat, nonlabored resps, good aeration in all lung fields. ABD: soft, NT, ND, BS normal.  No hepatospenomegaly or mass.  No bruits. EXT: no clubbing, cyanosis, or edema.  Musculoskeletal: no joint swelling, erythema, warmth, or tenderness.  ROM of all joints intact. Skin - no sores or suspicious lesions or rashes or color changes   Pertinent labs:  Lab Results  Component Value Date   TSH 0.69 08/31/2012   Lab Results  Component Value Date   WBC 8.9 08/31/2012   HGB 14.3 08/31/2012   HCT 43.0 08/31/2012   MCV 82.8 08/31/2012   PLT 238.0 08/31/2012   Lab Results  Component Value Date   CREATININE 0.7 08/31/2012   BUN 15 08/31/2012   NA 137 08/31/2012   K 3.9 08/31/2012   CL 101 08/31/2012   CO2 28 08/31/2012   Lab Results  Component Value Date   ALT 19 08/31/2012   AST 18 08/31/2012   ALKPHOS 88 08/31/2012   BILITOT 1.0 08/31/2012   Lab Results  Component Value Date   CHOL 179 08/31/2012   Lab Results  Component Value Date   HDL 46.10 08/31/2012   Lab Results  Component Value Date   LDLCALC 107* 08/31/2012   Lab Results  Component Value Date   TRIG 131.0 08/31/2012   Lab Results  Component Value Date   CHOLHDL 4 08/31/2012    ASSESSMENT AND PLAN:   1) HTN; The current medical regimen is effective;  continue present plan and medications. Lytes/cr today.  2) Screening for hyperlipidemia: FLP today as well as hepatic panel.  3) Preventative health care: prevnar 13 IM today. She is  going to get in with her GYN soon per her report so she can get back [facility] with DEXA, breast ca screening, and cervical ca screening.  4) Muscle tension pain, cervical spine region: periodically needs small dose of flexeril and this helps immensely. RF'd cyclobenzaprine today.  An After Visit Summary was printed and given to the patient.  FOLLOW UP:  No Follow-up on file.

## 2014-07-13 ENCOUNTER — Other Ambulatory Visit: Payer: Self-pay | Admitting: Family Medicine

## 2014-07-13 ENCOUNTER — Encounter: Payer: Self-pay | Admitting: Family Medicine

## 2014-07-13 DIAGNOSIS — R7301 Impaired fasting glucose: Secondary | ICD-10-CM

## 2014-07-13 DIAGNOSIS — E781 Pure hyperglyceridemia: Secondary | ICD-10-CM

## 2014-07-13 NOTE — Progress Notes (Signed)
Nutritionist referral ordered. 

## 2014-07-17 ENCOUNTER — Other Ambulatory Visit (INDEPENDENT_AMBULATORY_CARE_PROVIDER_SITE_OTHER): Payer: Commercial Managed Care - HMO

## 2014-07-17 DIAGNOSIS — R7301 Impaired fasting glucose: Secondary | ICD-10-CM | POA: Diagnosis not present

## 2014-07-17 LAB — HEMOGLOBIN A1C: Hgb A1c MFr Bld: 5.3 % (ref 4.6–6.5)

## 2014-07-20 ENCOUNTER — Other Ambulatory Visit: Payer: Self-pay | Admitting: *Deleted

## 2014-08-20 ENCOUNTER — Other Ambulatory Visit: Payer: Self-pay | Admitting: *Deleted

## 2014-08-20 MED ORDER — BISOPROLOL-HYDROCHLOROTHIAZIDE 5-6.25 MG PO TABS: ORAL_TABLET | ORAL | Status: DC

## 2014-08-20 NOTE — Telephone Encounter (Signed)
RF request for Bisoprolol/hctz LOV: 07/12/14 Next ov: 01/11/15 Last written: 06/20/14 #90 w/ 1RF

## 2014-08-30 ENCOUNTER — Ambulatory Visit: Payer: Commercial Managed Care - HMO | Admitting: Dietician

## 2014-09-06 ENCOUNTER — Other Ambulatory Visit: Payer: Self-pay | Admitting: *Deleted

## 2014-09-06 MED ORDER — BISOPROLOL-HYDROCHLOROTHIAZIDE 5-6.25 MG PO TABS: ORAL_TABLET | ORAL | Status: DC

## 2014-09-06 NOTE — Telephone Encounter (Signed)
RF request for bisoprolol/hctz LOV: 07/12/14 Next ov: 01/11/15 Last written: 08/20/14 #90 w/ 1RF sent to local pharmacy.

## 2014-09-10 ENCOUNTER — Other Ambulatory Visit: Payer: Self-pay | Admitting: *Deleted

## 2014-09-10 NOTE — Telephone Encounter (Signed)
Opened in Error.

## 2014-11-16 ENCOUNTER — Ambulatory Visit (INDEPENDENT_AMBULATORY_CARE_PROVIDER_SITE_OTHER): Payer: Commercial Managed Care - HMO

## 2014-11-16 DIAGNOSIS — Z23 Encounter for immunization: Secondary | ICD-10-CM

## 2014-12-04 DIAGNOSIS — S4991XA Unspecified injury of right shoulder and upper arm, initial encounter: Secondary | ICD-10-CM

## 2014-12-04 HISTORY — DX: Unspecified injury of right shoulder and upper arm, initial encounter: S49.91XA

## 2014-12-06 ENCOUNTER — Emergency Department (INDEPENDENT_AMBULATORY_CARE_PROVIDER_SITE_OTHER): Payer: Commercial Managed Care - HMO

## 2014-12-06 ENCOUNTER — Encounter: Payer: Self-pay | Admitting: *Deleted

## 2014-12-06 ENCOUNTER — Emergency Department (INDEPENDENT_AMBULATORY_CARE_PROVIDER_SITE_OTHER)
Admission: EM | Admit: 2014-12-06 | Discharge: 2014-12-06 | Disposition: A | Discharge status: Home / Self Care | Payer: Commercial Managed Care - HMO | Source: Home / Self Care | Attending: Family Medicine | Admitting: Family Medicine

## 2014-12-06 DIAGNOSIS — M4184 Other forms of scoliosis, thoracic region: Secondary | ICD-10-CM

## 2014-12-06 DIAGNOSIS — S20212A Contusion of left front wall of thorax, initial encounter: Secondary | ICD-10-CM

## 2014-12-06 DIAGNOSIS — S161XXA Strain of muscle, fascia and tendon at neck level, initial encounter: Secondary | ICD-10-CM

## 2014-12-06 DIAGNOSIS — S46001A Unspecified injury of muscle(s) and tendon(s) of the rotator cuff of right shoulder, initial encounter: Secondary | ICD-10-CM

## 2014-12-06 DIAGNOSIS — R0781 Pleurodynia: Secondary | ICD-10-CM | POA: Diagnosis not present

## 2014-12-06 DIAGNOSIS — M542 Cervicalgia: Secondary | ICD-10-CM | POA: Diagnosis not present

## 2014-12-06 DIAGNOSIS — M25511 Pain in right shoulder: Secondary | ICD-10-CM | POA: Diagnosis not present

## 2014-12-06 DIAGNOSIS — J9811 Atelectasis: Secondary | ICD-10-CM

## 2014-12-06 DIAGNOSIS — M47812 Spondylosis without myelopathy or radiculopathy, cervical region: Secondary | ICD-10-CM

## 2014-12-06 MED ORDER — HYDROCODONE-ACETAMINOPHEN 5-325 MG PO TABS: ORAL_TABLET | ORAL | Status: DC

## 2014-12-06 MED ORDER — MELOXICAM 7.5 MG PO TABS: ORAL_TABLET | ORAL | Status: DC
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
## 2014-12-09 NOTE — Discharge Instructions (Signed)
Apply ice pack for 20 to 30 minutes, 3 to 4 times daily  Continue until pain decreases.  Begin neck range of motion exercises in 3 to 5 days.  Continue shoulder pendulum exercises.  Wear rib belt until pain improves.   Cervical Sprain A cervical sprain is an injury in the neck in which the strong, fibrous tissues (ligaments) that connect your neck bones stretch or tear. Cervical sprains can range from mild to severe. Severe cervical sprains can cause the neck vertebrae to be unstable. This can lead to damage of the spinal cord and can result in serious nervous system problems. The amount of time it takes for a cervical sprain to get better depends on the cause and extent of the injury. Most cervical sprains heal in 1 to 3 weeks. CAUSES  Severe cervical sprains may be caused by:   Contact sport injuries (such as from football, rugby, wrestling, hockey, auto racing, gymnastics, diving, martial arts, or boxing).   Motor vehicle collisions.   Whiplash injuries. This is an injury from a sudden forward and backward whipping movement of the head and neck.  Falls.  Mild cervical sprains may be caused by:   Being in an awkward position, such as while cradling a telephone between your ear and shoulder.   Sitting in a chair that does not offer proper support.   Working at a poorly Marketing executive station.   Looking up or down for long periods of time.  SYMPTOMS   Pain, soreness, stiffness, or a burning sensation in the front, back, or sides of the neck. This discomfort may develop immediately after the injury or slowly, 24 hours or more after the injury.   Pain or tenderness directly in the middle of the back of the neck.   Shoulder or upper back pain.   Limited ability to move the neck.   Headache.   Dizziness.   Weakness, numbness, or tingling in the hands or arms.   Muscle spasms.   Difficulty swallowing or chewing.   Tenderness and swelling of the neck.   DIAGNOSIS  Most of the time your health care provider can diagnose a cervical sprain by taking your history and doing a physical exam. Your health care provider will ask about previous neck injuries and any known neck problems, such as arthritis in the neck. X-rays may be taken to find out if there are any other problems, such as with the bones of the neck. Other tests, such as a CT scan or MRI, may also be needed.  TREATMENT  Treatment depends on the severity of the cervical sprain. Mild sprains can be treated with rest, keeping the neck in place (immobilization), and pain medicines. Severe cervical sprains are immediately immobilized. Further treatment is done to help with pain, muscle spasms, and other symptoms and may include:  Medicines, such as pain relievers, numbing medicines, or muscle relaxants.   Physical therapy. This may involve stretching exercises, strengthening exercises, and posture training. Exercises and improved posture can help stabilize the neck, strengthen muscles, and help stop symptoms from returning.  HOME CARE INSTRUCTIONS   Put ice on the injured area.   Put ice in a plastic bag.   Place a towel between your skin and the bag.   Leave the ice on for 15-20 minutes, 3-4 times a day.   If your injury was severe, you may have been given a cervical collar to wear. A cervical collar is a two-piece collar designed to keep your neck  from moving while it heals.  Do not remove the collar unless instructed by your health care provider.  If you have long hair, keep it outside of the collar.  Ask your health care provider before making any adjustments to your collar. Minor adjustments may be required over time to improve comfort and reduce pressure on your chin or on the back of your head.  Ifyou are allowed to remove the collar for cleaning or bathing, follow your health care provider's instructions on how to do so safely.  Keep your collar clean by wiping it with  mild soap and water and drying it completely. If the collar you have been given includes removable pads, remove them every 1-2 days and hand wash them with soap and water. Allow them to air dry. They should be completely dry before you wear them in the collar.  If you are allowed to remove the collar for cleaning and bathing, wash and dry the skin of your neck. Check your skin for irritation or sores. If you see any, tell your health care provider.  Do not drive while wearing the collar.   Only take over-the-counter or prescription medicines for pain, discomfort, or fever as directed by your health care provider.   Keep all follow-up appointments as directed by your health care provider.   Keep all physical therapy appointments as directed by your health care provider.   Make any needed adjustments to your workstation to promote good posture.   Avoid positions and activities that make your symptoms worse.   Warm up and stretch before being active to help prevent problems.  SEEK MEDICAL CARE IF:   Your pain is not controlled with medicine.   You are unable to decrease your pain medicine over time as planned.   Your activity level is not improving as expected.  SEEK IMMEDIATE MEDICAL CARE IF:   You develop any bleeding.  You develop stomach upset.  You have signs of an allergic reaction to your medicine.   Your symptoms get worse.   You develop new, unexplained symptoms.   You have numbness, tingling, weakness, or paralysis in any part of your body.  MAKE SURE YOU:   Understand these instructions.  Will watch your condition.  Will get help right away if you are not doing well or get worse.   This information is not intended to replace advice given to you by your health care provider. Make sure you discuss any questions you have with your health care provider.   Document Released: 11/16/2006 Document Revised: 01/24/2013 Document Reviewed: 07/27/2012 Elsevier  Interactive Patient Education 2016 [employer]   Chest Contusion A chest contusion is a deep bruise on your chest area. Contusions are the result of an injury that caused bleeding under the skin. A chest contusion may involve bruising of the skin, muscles, or ribs. The contusion may turn blue, purple, or yellow. Minor injuries will give you a painless contusion, but more severe contusions may stay painful and swollen for a few weeks. CAUSES  A contusion is usually caused by a blow, trauma, or direct force to an area of the body. SYMPTOMS   Swelling and redness of the injured area.  Discoloration of the injured area.  Tenderness and soreness of the injured area.  Pain. DIAGNOSIS  The diagnosis can be made by taking a history and performing a physical exam. An X-ray, CT scan, or MRI may be needed to determine if there were any associated injuries, such as  broken bones (fractures) or internal injuries. TREATMENT  Often, the best treatment for a chest contusion is resting, icing, and applying cold compresses to the injured area. Deep breathing exercises may be recommended to reduce the risk of pneumonia. Over-the-counter medicines may also be recommended for pain control. HOME CARE INSTRUCTIONS   Put ice on the injured area.  Put ice in a plastic bag.  Place a towel between your skin and the bag.  Leave the ice on for 15-20 minutes, 03-04 times a day.  Only take over-the-counter or prescription medicines as directed by your caregiver. Your caregiver may recommend avoiding anti-inflammatory medicines (aspirin, ibuprofen, and naproxen) for 48 hours because these medicines may increase bruising.  Rest the injured area.  Perform deep-breathing exercises as directed by your caregiver.  Stop smoking if you smoke.  Do not lift objects over 5 pounds (2.3 kg) for 3 days or longer if recommended by your caregiver. SEEK IMMEDIATE MEDICAL CARE IF:   You have increased bruising or  swelling.  You have pain that is getting worse.  You have difficulty breathing.  You have dizziness, weakness, or fainting.  You have blood in your urine or stool.  You cough up or vomit blood.  Your swelling or pain is not relieved with medicines. MAKE SURE YOU:   Understand these instructions.  Will watch your condition.  Will get help right away if you are not doing well or get worse.   This information is not intended to replace advice given to you by your health care provider. Make sure you discuss any questions you have with your health care provider.   Document Released: 10/14/2000 Document Revised: 10/14/2011 Document Reviewed: 07/13/2011 Elsevier Interactive Patient Education 2016 [employer] Inc.   Rotator Cuff Injury Rotator cuff injury is any type of injury to the set of muscles and tendons that make up the stabilizing unit of your shoulder. This unit holds the ball of your upper arm bone (humerus) in the socket of your shoulder blade (scapula).  CAUSES Injuries to your rotator cuff most commonly come from sports or activities that cause your arm to be moved repeatedly over your head. Examples of this include throwing, weight lifting, swimming, or racquet sports. Long lasting (chronic) irritation of your rotator cuff can cause soreness and swelling (inflammation), bursitis, and eventual damage to your tendons, such as a tear (rupture). SIGNS AND SYMPTOMS Acute rotator cuff tear:  Sudden tearing sensation followed by severe pain shooting from your upper shoulder down your arm toward your elbow.  Decreased range of motion of your shoulder because of pain and muscle spasm.  Severe pain.  Inability to raise your arm out to the side because of pain and loss of muscle power (large tears). Chronic rotator cuff tear:  Pain that usually is worse at night and may interfere with sleep.  Gradual weakness and decreased shoulder motion as the pain worsens.  Decreased range  of motion. Rotator cuff tendinitis:  Deep ache in your shoulder and the outside upper arm over your shoulder.  Pain that comes on gradually and becomes worse when lifting your arm to the side or turning it inward. DIAGNOSIS Rotator cuff injury is diagnosed through a medical history, physical exam, and imaging exam. The medical history helps determine the type of rotator cuff injury. Your health care provider will look at your injured shoulder, feel the injured area, and ask you to move your shoulder in different positions. X-ray exams typically are done to rule out other  causes of shoulder pain, such as fractures. MRI is the exam of choice for the most severe shoulder injuries because the images show muscles and tendons.  TREATMENT  Chronic tear:  Medicine for pain, such as acetaminophen or ibuprofen.  Physical therapy and range-of-motion exercises may be helpful in maintaining shoulder function and strength.  Steroid injections into your shoulder joint.  Surgical repair of the rotator cuff if the injury does not heal with noninvasive treatment. Acute tear:  Anti-inflammatory medicines such as ibuprofen and naproxen to help reduce pain and swelling.  A sling to help support your arm and rest your rotator cuff muscles. Long-term use of a sling is not advised. It may cause significant stiffening of the shoulder joint.  Surgery may be considered within a few weeks, especially in younger, active people, to return the shoulder to full function.  Indications for surgical treatment include the following:  Age younger than 60 years.  Rotator cuff tears that are complete.  Physical therapy, rest, and anti-inflammatory medicines have been used for 6-8 weeks, with no improvement.  Employment or sporting activity that requires constant shoulder use. Tendinitis:  Anti-inflammatory medicines such as ibuprofen and naproxen to help reduce pain and swelling.  A sling to help support your arm  and rest your rotator cuff muscles. Long-term use of a sling is not advised. It may cause significant stiffening of the shoulder joint.  Severe tendinitis may require:  Steroid injections into your shoulder joint.  Physical therapy.  Surgery. HOME CARE INSTRUCTIONS   Apply ice to your injury:  Put ice in a plastic bag.  Place a towel between your skin and the bag.  Leave the ice on for 20 minutes, 2-3 times a day.  If you have a shoulder immobilizer (sling and straps), wear it until told otherwise by your health care provider.  You may want to sleep on several pillows or in a recliner at night to lessen swelling and pain.  Only take over-the-counter or prescription medicines for pain, discomfort, or fever as directed by your health care provider.  Do simple hand squeezing exercises with a soft rubber ball to decrease hand swelling. SEEK MEDICAL CARE IF:   Your shoulder pain increases, or new pain or numbness develops in your arm, hand, or fingers.  Your hand or fingers are colder than your other hand. SEEK IMMEDIATE MEDICAL CARE IF:   Your arm, hand, or fingers are numb or tingling.  Your arm, hand, or fingers are increasingly swollen and painful, or they turn white or blue. MAKE SURE YOU:  Understand these instructions.  Will watch your condition.  Will get help right away if you are not doing well or get worse.   This information is not intended to replace advice given to you by your health care provider. Make sure you discuss any questions you have with your health care provider.   Document Released: 01/17/2000 Document Revised: 01/24/2013 Document Reviewed: 08/31/2012 Elsevier Interactive Patient Education [employer] Inc.

## 2015-01-11 ENCOUNTER — Ambulatory Visit: Payer: Commercial Managed Care - HMO | Admitting: Family Medicine

## 2015-02-03 DIAGNOSIS — S2232XA Fracture of one rib, left side, initial encounter for closed fracture: Secondary | ICD-10-CM

## 2015-02-03 HISTORY — DX: Fracture of one rib, left side, initial encounter for closed fracture: S22.32XA

## 2015-02-15 ENCOUNTER — Telehealth: Payer: Self-pay | Admitting: Family Medicine

## 2015-03-18 ENCOUNTER — Other Ambulatory Visit: Payer: Self-pay | Admitting: Family Medicine

## 2015-03-18 NOTE — Telephone Encounter (Signed)
RF request for bisoprolol/hctz LOV: 07/12/14 Next ov: None Last written: 09/06/14 #270 w/ 1RF

## 2015-03-29 ENCOUNTER — Ambulatory Visit (INDEPENDENT_AMBULATORY_CARE_PROVIDER_SITE_OTHER): Payer: Commercial Managed Care - HMO | Admitting: Sports Medicine

## 2015-03-29 ENCOUNTER — Encounter: Payer: Self-pay | Admitting: Sports Medicine

## 2015-03-29 VITALS — BP 146/89 | HR 66 | Resp 18 | Wt 133.7 lb

## 2015-03-29 DIAGNOSIS — M25511 Pain in right shoulder: Secondary | ICD-10-CM
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
## 2015-03-29 NOTE — Assessment & Plan Note (Addendum)
Glenohumeral and acromioclavicular degenerative changes on x-ray however pain today is referable strongly to the acromioclavicular joint. Acromioclavicular joint injection as above. Acromioclavicular joint injection provided 100% complete pain relief, she does have evidence of a chronic full-thickness rotator cuff tear with a high riding humeral head. I was unable to see the supraspinatus on imaging confirming full rotator cuff tear however her pain is not subacromial. Return to see me in one month.

## 2015-04-01 ENCOUNTER — Encounter: Payer: Self-pay | Admitting: Family Medicine

## 2015-04-18 DIAGNOSIS — H43813 Vitreous degeneration, bilateral: Secondary | ICD-10-CM | POA: Diagnosis not present

## 2015-04-18 DIAGNOSIS — H5203 Hypermetropia, bilateral: Secondary | ICD-10-CM | POA: Diagnosis not present

## 2015-04-18 DIAGNOSIS — H2513 Age-related nuclear cataract, bilateral: Secondary | ICD-10-CM | POA: Diagnosis not present

## 2015-04-26 ENCOUNTER — Ambulatory Visit (INDEPENDENT_AMBULATORY_CARE_PROVIDER_SITE_OTHER): Payer: Commercial Managed Care - HMO | Admitting: Sports Medicine

## 2015-04-26 ENCOUNTER — Encounter: Payer: Self-pay | Admitting: Sports Medicine

## 2015-04-26 VITALS — BP 129/80 | HR 68 | Resp 18 | Wt 133.2 lb

## 2015-04-26 DIAGNOSIS — M25511 Pain in right shoulder: Secondary | ICD-10-CM

## 2015-04-26 NOTE — Progress Notes (Signed)
  Subjective:    CC: Follow-up  HPI: Right shoulder pain: Diagnosis acromioclavicular osteoarthritis, fantastic response to injection, had a brief recurrence of pain but overall is doing okay, still has significant pain when reaching across her chest. Pain is localized, without radiation.  Past medical history, Surgical history, Family history not pertinant except as noted below, Social history, Allergies, and medications have been entered into the medical record, reviewed, and no changes needed.   Review of Systems: No fevers, chills, night sweats, weight loss, chest pain, or shortness of breath.   Objective:    General: Well Developed, well nourished, and in no acute distress.  Neuro: Alert and oriented x3, extra-ocular muscles intact, sensation grossly intact.  HEENT: Normocephalic, atraumatic, pupils equal round reactive to light, neck supple, no masses, no lymphadenopathy, thyroid nonpalpable.  Skin: Warm and dry, no rashes. Cardiac: Regular rate and rhythm, no murmurs rubs or gallops, no lower extremity edema.  Respiratory: Clear to auscultation bilaterally. Not using accessory muscles, speaking in full sentences. Right Shoulder: Inspection reveals no abnormalities, atrophy or asymmetry. Tender over the acromioclavicular joint with a positive cross arm sign ROM is full in all planes. Rotator cuff strength normal throughout. No signs of impingement with negative Neer and Hawkin's tests, empty can. Speeds and Yergason's tests normal. No labral pathology noted with negative Obrien's, negative crank, negative clunk, and good stability. Normal scapular function observed. No painful arc and no drop arm sign. No apprehension sign  Procedure: Real-time Ultrasound Guided Injection of right acromioclavicular joint Device: GE Logiq E  Verbal informed consent obtained.  Time-out conducted.  Noted no overlying erythema, induration, or other signs of local infection.  Skin prepped in a  sterile fashion.  Local anesthesia: Topical Ethyl chloride.  With sterile technique and under real time ultrasound guidance:  1 mL Depo-Medrol 40, 1/2 mL lidocaine injected easily. Completed without difficulty  Pain immediately resolved suggesting accurate placement of the medication.  Advised to call if fevers/chills, erythema, induration, drainage, or persistent bleeding.  Images permanently stored and available for review in the ultrasound unit.  Impression: Technically successful ultrasound guided injection.  Impression and Recommendations:

## 2015-04-26 NOTE — Assessment & Plan Note (Signed)
Acromioclavicular osteoarthritis injected the second time today this time with Depo-Medrol. Return in one month, referral for distal clavicular excision if no better.

## 2015-05-10 ENCOUNTER — Encounter: Payer: Self-pay | Admitting: Family Medicine

## 2015-05-10 ENCOUNTER — Telehealth: Payer: Self-pay | Admitting: Family Medicine
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
## 2015-05-10 NOTE — Telephone Encounter (Signed)
Please advise. Thanks.  

## 2015-05-10 NOTE — Telephone Encounter (Signed)
Jury letter written/printed.

## 2015-05-13 NOTE — Telephone Encounter (Signed)
Letter put up front for p/u. Left detailed message on home vm, okay per DPR.

## 2015-05-24 ENCOUNTER — Ambulatory Visit: Payer: Commercial Managed Care - HMO | Admitting: Sports Medicine

## 2015-10-15 ENCOUNTER — Ambulatory Visit (INDEPENDENT_AMBULATORY_CARE_PROVIDER_SITE_OTHER): Payer: Commercial Managed Care - HMO | Admitting: Family Medicine

## 2015-10-15 ENCOUNTER — Encounter: Payer: Self-pay | Admitting: Family Medicine

## 2015-10-15 VITALS — BP 137/79 | HR 69 | Temp 97.9°F | Resp 16 | Wt 134.0 lb

## 2015-10-15 DIAGNOSIS — M159 Polyosteoarthritis, unspecified: Secondary | ICD-10-CM

## 2015-10-15 DIAGNOSIS — J3089 Other allergic rhinitis: Secondary | ICD-10-CM | POA: Diagnosis not present

## 2015-10-15 DIAGNOSIS — M150 Primary generalized (osteo)arthritis: Secondary | ICD-10-CM | POA: Diagnosis not present

## 2015-10-15 DIAGNOSIS — I10 Essential (primary) hypertension: Secondary | ICD-10-CM | POA: Diagnosis not present

## 2015-10-15 LAB — BASIC METABOLIC PANEL
BUN: 16 mg/dL (ref 6–23)
CHLORIDE: 102 meq/L (ref 96–112)
CO2: 30 mEq/L (ref 19–32)
CREATININE: 0.61 mg/dL (ref 0.40–1.20)
Calcium: 9.1 mg/dL (ref 8.4–10.5)
GFR: 103.31 mL/min (ref >60.00)
GLUCOSE: 100 mg/dL — AB (ref 70–99)
POTASSIUM: 4.7 meq/L (ref 3.5–5.1)
Sodium: 138 mEq/L (ref 135–145)

## 2015-10-15 MED ORDER — CYCLOBENZAPRINE HCL 10 MG PO TABS: 10.0000 mg | ORAL_TABLET | Freq: Three times a day (TID) | ORAL | 1 refills | Status: DC | PRN

## 2015-10-15 MED ORDER — BISOPROLOL-HYDROCHLOROTHIAZIDE 5-6.25 MG PO TABS: ORAL_TABLET | ORAL | 1 refills | Status: DC

## 2015-10-15 MED ORDER — MELOXICAM 7.5 MG PO TABS: ORAL_TABLET | ORAL | 1 refills | Status: DC

## 2015-10-15 MED ORDER — MOMETASONE FUROATE 50 MCG/ACT NA SUSP: 2.0000 | Freq: Every day | NASAL | 3 refills | Status: DC

## 2015-10-15 NOTE — Progress Notes (Signed)
OFFICE VISIT  10/15/2015   CC:  Chief Complaint  Patient presents with  . Follow-up    Refill on medication   HPI:    Patient is a 70 y.o. Caucasian female who presents for f/u HTN, osteoarthritis, allergic rhinitis. She has joined the [employer].  Feels sore! Monitoring bp at home: all normal. Compliant with all 3 bp pills except occ missed dose.  Taking nasonex and benadryl on regular basis.  Still having quite a bit of runny nose and sneezing. No sinus pressure, no purulent mucous.  No wheezing, cough, or SOB.  For arthralgias/arthritis in knees, hands, feet, she feels like meloxicam helps a lot but she takes this < 3 times per week.  ROS: no CP, no palpitations, no dizziness, no HAs, no vision complaints.  Past Medical History:  Diagnosis Date  . Allergic rhinitis    Fall>spring  . Headache(784.0)   . Hypertension   . IFG (impaired fasting glucose)   . Kidney stones    x 1 episode  . Left rib fracture 2017  . Migraines    when "younger"  . Osteoarthritis    Hands, feet, knees: takes meloxicam occas  . Right shoulder injury 12/2014   R AC joint arthritis--steroid injection by ortho/sportsmed MD helped.  Pt also has full thickness RC tear on right.    Past Surgical History:  Procedure Laterality Date  . COLONOSCOPY  08/02/06    Outpatient Medications Prior to Visit  Medication Sig Dispense Refill  . bisoprolol-hydrochlorothiazide (ZIAC) 5-6.25 MG tablet TAKE 2 TABLETS EVERY MORNING  AND TAKE 1 TABLET EVERY EVENING 270 tablet 1  . cyclobenzaprine (FLEXERIL) 10 MG tablet Take 1 tablet (10 mg total) by mouth 3 (three) times daily as needed for muscle spasms. 30 tablet 2  . meloxicam (MOBIC) 7.5 MG tablet Take one tab by mouth each morning with food 15 tablet 0  . mometasone (NASONEX) 50 MCG/ACT nasal spray Place 2 sprays into the nose daily. 51 g 3   No facility-administered medications prior to visit.     Allergies  Allergen Reactions  . Penicillins   .  Sulfonamide Derivatives     ROS As per HPI  PE: Blood pressure 137/79, pulse 69, temperature 97.9 F (36.6 C), temperature source Oral, resp. rate 16, weight 134 lb (60.8 kg), SpO2 97 %. Gen: Alert, well appearing.  Patient is oriented to person, place, time, and situation. CV: RRR, no m/r/g.   LUNGS: CTA bilat, nonlabored resps, good aeration in all lung fields. EXT: no clubbing, cyanosis, or edema.    LABS:  Lab Results  Component Value Date   TSH 0.69 08/31/2012   Lab Results  Component Value Date   WBC 8.9 08/31/2012   HGB 14.3 08/31/2012   HCT 43.0 08/31/2012   MCV 82.8 08/31/2012   PLT 238.0 08/31/2012   Lab Results  Component Value Date   CREATININE 0.68 07/12/2014   BUN 18 07/12/2014   NA 136 07/12/2014   K 4.1 07/12/2014   CL 100 07/12/2014   CO2 30 07/12/2014  Glucose on 07/12/14 was 113.  Lab Results  Component Value Date   ALT 18 07/12/2014   AST 19 07/12/2014   ALKPHOS 88 07/12/2014   BILITOT 0.9 07/12/2014   Lab Results  Component Value Date   CHOL 167 07/12/2014   Lab Results  Component Value Date   HDL 39.40 07/12/2014   Lab Results  Component Value Date   LDLCALC 107 (H) 08/31/2012  Lab Results  Component Value Date   TRIG 229.0 (H) 07/12/2014   Lab Results  Component Value Date   CHOLHDL 4 07/12/2014   Lab Results  Component Value Date   HGBA1C 5.3 07/17/2014   IMPRESSION AND PLAN:  1) HTN: The current medical regimen is effective;  continue present plan and medications. BMET today.  2) Allergic rhinitis: not ideally controlled due to weather/season. RF'd nasonex and she'll continue to use benadryl prn.  3) Osteoarthritis: hands, feet, knees.  She takes meloxicam <3 days per week on average and says this is helpful. RF'd meloxicam today.  An After Visit Summary was printed and given to the patient.  FOLLOW UP: Return in about 6 months (around 04/13/2016) for annual CPE (fasting).  Signed:  Santiago BumpersPhil McGowen, MD            10/15/2015

## 2015-10-18 ENCOUNTER — Telehealth: Payer: Self-pay | Admitting: Family Medicine

## 2015-10-18 MED ORDER — MELOXICAM 7.5 MG PO TABS: ORAL_TABLET | ORAL | 1 refills | Status: DC

## 2015-10-18 NOTE — Telephone Encounter (Signed)
[facility] as per pt request.

## 2015-10-18 NOTE — Telephone Encounter (Signed)
Patient requesting # 10 [facility] because pt is completely out until [employer] ships her medication.  Please advise.

## 2015-10-20 ENCOUNTER — Telehealth: Payer: Self-pay | Admitting: Family Medicine

## 2015-10-20 ENCOUNTER — Other Ambulatory Visit: Payer: Self-pay | Admitting: Family Medicine

## 2015-10-20 MED ORDER — FLUNISOLIDE 25 MCG/ACT (0.025%) NA SOLN: 2.0000 | Freq: Two times a day (BID) | NASAL | 12 refills | Status: DC

## 2015-10-20 NOTE — Telephone Encounter (Signed)
Mometasone nasal spray was not approved by her insurer. I eRx'd flunisolide nasal spray instead.  -thx

## 2015-10-21 NOTE — Telephone Encounter (Signed)
Left detailed message on pt's phone.  Okay per DPR. 

## 2015-12-13 ENCOUNTER — Encounter: Payer: Self-pay | Admitting: Emergency Medicine

## 2015-12-13 ENCOUNTER — Emergency Department (INDEPENDENT_AMBULATORY_CARE_PROVIDER_SITE_OTHER)
Admission: EM | Admit: 2015-12-13 | Discharge: 2015-12-13 | Disposition: A | Discharge status: Home / Self Care | Payer: Commercial Managed Care - HMO | Source: Home / Self Care

## 2015-12-13 DIAGNOSIS — Z23 Encounter for immunization: Secondary | ICD-10-CM

## 2015-12-13 MED ORDER — INFLUENZA VAC SPLIT QUAD 0.5 ML IM SUSY
0.5000 mL | PREFILLED_SYRINGE | Freq: Once | INTRAMUSCULAR | Status: AC
  Administered 2015-12-13: 0.5 mL via INTRAMUSCULAR

## 2015-12-13 NOTE — ED Triage Notes (Signed)
Desires flu vaccination. 

## 2016-02-05 DIAGNOSIS — Z1231 Encounter for screening mammogram for malignant neoplasm of breast: Secondary | ICD-10-CM | POA: Diagnosis not present

## 2016-02-05 LAB — HM MAMMOGRAPHY

## 2016-02-06 ENCOUNTER — Encounter: Payer: Self-pay | Admitting: Family Medicine

## 2016-04-14 ENCOUNTER — Encounter: Payer: Commercial Managed Care - HMO | Admitting: Family Medicine

## 2016-04-20 DIAGNOSIS — H52221 Regular astigmatism, right eye: Secondary | ICD-10-CM | POA: Diagnosis not present

## 2016-04-20 DIAGNOSIS — H5203 Hypermetropia, bilateral: Secondary | ICD-10-CM | POA: Diagnosis not present

## 2016-04-20 DIAGNOSIS — H524 Presbyopia: Secondary | ICD-10-CM | POA: Diagnosis not present

## 2016-04-20 DIAGNOSIS — H2513 Age-related nuclear cataract, bilateral: Secondary | ICD-10-CM | POA: Diagnosis not present

## 2016-04-21 ENCOUNTER — Encounter: Payer: Commercial Managed Care - HMO | Admitting: Family Medicine

## 2016-05-07 NOTE — Progress Notes (Signed)
Pre visit review using our clinic review tool, if applicable. No additional management support is needed unless otherwise documented below in the visit note. 

## 2016-05-07 NOTE — Progress Notes (Signed)
Subjective:   Sandy Gill is a 71 y.o. female who presents for an Initial Medicare Annual Wellness Visit.  Review of Systems    No ROS.  Medicare Wellness Visit.  Cardiac Risk Factors include: advanced age (>19men, >54 women);family history of premature cardiovascular disease;hypertension   Sleep patterns:  Sleeps about 8 hours, up to void x 1.  Home Safety/Smoke Alarms: Smoke detectors and security in place.    Living environment; residence and Firearm Safety: Lives with husband and grand-daughter in 1 story home, steps at door with rail. Feels safe in home. Firearms locked away.  Seat Belt Safety/Bike Helmet: Wears seat belt.   Counseling:   Eye Exam-Last exam 04/2016, pt reports cataracts. Yearly by [facility] in [place].   Dental-Last exam 04/2016, every 6 months by Dr. Melvyn Neth in [place].   Female:   Pap-03/01/2009       Mammo-02/05/2016, benign.        Dexa scan-Last > 2 years. Will schedule with GYN      [facility]-Colonoscopy 08/02/2006, normal. Recall 10 years. [facility].        Objective:    Today's Vitals   05/08/16 0821  BP: 132/70  Pulse: 61  Resp: 18  Temp: 98.2 F (36.8 C)  SpO2: 97%  Weight: 130 lb 1.9 oz (59 kg)  Height:  (1.6 m)   Body mass index is 23.05 kg/m.   Current Medications (verified) Outpatient Encounter Prescriptions as of 05/08/2016  Medication Sig  . bisoprolol-hydrochlorothiazide (ZIAC) 5-6.25 MG tablet TAKE 2 TABLETS EVERY MORNING  AND TAKE 1 TABLET EVERY EVENING  . diphenhydrAMINE (BENADRYL) 25 MG tablet Take 25 mg by mouth every 6 (six) hours as needed.  . flunisolide (NASALIDE) 25 MCG/ACT (0.025%) SOLN Place 2 sprays into the nose 2 (two) times daily.  . Multiple Vitamins-Minerals (WOMENS MULTIVITAMIN PO) Take by mouth.  . Omega-3 Fatty Acids (OMEGA 3 PO) Take 1 tablet by mouth.  . cyclobenzaprine (FLEXERIL) 10 MG tablet Take 1 tablet (10 mg total) by mouth 3 (three) times daily as needed for muscle spasms. (Patient not  taking: Reported on 05/08/2016)  . meloxicam (MOBIC) 7.5 MG tablet Take one tab by mouth each morning with food prn (Patient not taking: Reported on 05/08/2016)  . Zoster Vac Recomb Adjuvanted [facility]) injection Inject 0.5 mLs into the muscle once.   No facility-administered encounter medications on file as of 05/08/2016.     Allergies (verified) Penicillins and Sulfonamide derivatives   History: Past Medical History:  Diagnosis Date  . Allergic rhinitis    Fall>spring  . Headache(784.0)   . Hypertension   . IFG (impaired fasting glucose)   . Kidney stones    x 1 episode  . Left rib fracture 2017  . Migraines    when "younger"  . Osteoarthritis    Hands, feet, knees: takes meloxicam occas  . Right shoulder injury 12/2014   R AC joint arthritis--steroid injection by ortho/sportsmed MD helped.  Pt also has full thickness RC tear on right.   Past Surgical History:  Procedure Laterality Date  . COLONOSCOPY  08/02/06   Family History  Problem Relation Age of Onset  . Arthritis Mother   . Hypertension Mother   . Arthritis Father   . Heart disease Father   . Hypertension Father   . AAA (abdominal aortic aneurysm) Father     age of death 72  . Arthritis Sister   . Hypertension Sister   . Hypertension    .  Coronary artery disease     Social History   Occupational History  . Not on file.   Social History Main Topics  . Smoking status: Never Smoker  . Smokeless tobacco: Never Used  . Alcohol use No  . Drug use: No  . Sexual activity: Not on file    Tobacco Counseling Counseling given: Not Answered   Activities of Daily Living In your present state of health, do you have any difficulty performing the following activities: 05/08/2016  Hearing? Y  Vision? N  Difficulty concentrating or making decisions? N  Walking or climbing stairs? N  Dressing or bathing? N  Doing errands, shopping? N  Preparing Food and eating ? N  Using the Toilet? N  In the past six months,  have you accidently leaked urine? N  Do you have problems with loss of bowel control? N  Managing your Medications? N  Managing your Finances? N  Housekeeping or managing your Housekeeping? N  Some recent data might be hidden    Immunizations and Health Maintenance Immunization History  Administered Date(s) Administered  . Influenza Split 01/08/2012  . Influenza Whole 02/02/2005, 11/25/2007, 10/30/2008, 12/10/2009  . Influenza, High Dose Seasonal PF 12/21/2012, 12/18/2013, 11/16/2014  . Influenza,inj,Quad PF,36+ Mos 12/13/2015  . Pneumococcal Conjugate-13 07/12/2014  . Pneumococcal Polysaccharide-23 05/08/2016  . Td 08/19/2009  . Zoster 07/27/2011   Health Maintenance Due  Topic Date Due  . Hepatitis C Screening  [birth year]  . DEXA SCAN  08/13/2011  . PNA vac Low Risk Adult (2 of 2 - PPSV23) 07/12/2015    Patient Care Team: Jeoffrey Massed, MD as PCP - General (Family Medicine) Monica Becton, MD as Consulting Physician (Sports Medicine)  Indicate any recent Medical Services you may have received from other than [facility] providers in the past year (date may be approximate).     Assessment:   This is a routine wellness examination for Sri. Physical assessment deferred to PCP.   Hearing/Vision screen  Hearing Screening   Method: Audiometry             Right ear:   Pass Pass Pass  Fail    Left ear:   Pass Pass Pass  Fail    Comments: Requesting referral to Audiology.  Vision Screening Comments: Wears glasses.   Dietary issues and exercise activities discussed: Current Exercise Habits: Structured exercise class, Type of exercise: Other - see comments (aerobic/row machine), Exercise limited by: None identified   Diet (meal preparation, eat out, water intake, caffeinated beverages, dairy products, fruits and vegetables): Drinks unsweet tea and water.   Breakfast: banana, eggs, soup Lunch: sandwich, eggs,  tomatoes Dinner: [employer], vegetables. Avoids fried food (once a month).     Goals      Patient Stated   . Patient (pt-stated)          "Continue to improve my diet"      Depression Screen PHQ 2/9 Scores 05/08/2016 06/20/2014 08/31/2012  PHQ - 2 Score 0 0 2  PHQ- 9 Score - - 13    Fall Risk Fall Risk  05/08/2016 06/20/2014 08/31/2012  Falls in the past year? No No No    Cognitive Function:       Ad8 score reviewed for issues:  Issues making decisions: no  Less interest in hobbies / activities: no  Repeats questions, stories (family complaining): no  Trouble using ordinary gadgets (microwave, computer, phone): no  Forgets the month or year: no  Mismanaging finances: no  Remembering appts: no  Daily problems with thinking and/or memory: no Ad8 score is=0     Screening Tests Health Maintenance  Topic Date Due  . Hepatitis C Screening  [birth year]  . DEXA SCAN  08/13/2011  . PNA vac Low Risk Adult (2 of 2 - PPSV23) 07/12/2015  . COLONOSCOPY  08/01/2016  . INFLUENZA VACCINE  09/02/2016  . MAMMOGRAM  02/04/2018  . TETANUS/TDAP  08/20/2019  Hepatitis C Screening obtained today. PPSV23 administered today. Shingrix RX sent to pharmacy.     Plan:      Schedule Colonoscopy after 08/01/2016.   Schedule Bone Density test.  Bring a copy of your advance directives to your next office visit.  Continue to eat heart healthy diet (full of fruits, vegetables, whole grains, lean protein, water--limit salt, fat, and sugar intake) and increase physical activity as tolerated.  Continue doing brain stimulating activities (puzzles, reading, adult coloring books, staying active) to keep memory sharp.   During the course of the visit, Aalia was educated and counseled about the following appropriate screening and preventive services:   Vaccines to include Pneumoccal, Influenza, Hepatitis B, Td, Zostavax, HCV  Cardiovascular disease screening  Colorectal cancer  screening  Bone density screening  Diabetes screening  Glaucoma screening  Mammography/PAP  Nutrition counseling  Patient Instructions (the written plan) were given to the patient.    Alysia Penna, RN   05/08/2016   _________________________________________________________ Lorain Childes: Hepatitis C Screening obtained today.         PPSV23 administered today.         Shingrix RX sent to pharmacy.

## 2016-05-08 ENCOUNTER — Encounter: Payer: Self-pay | Admitting: Family Medicine

## 2016-05-08 ENCOUNTER — Ambulatory Visit (INDEPENDENT_AMBULATORY_CARE_PROVIDER_SITE_OTHER): Payer: Medicare HMO | Admitting: Family Medicine

## 2016-05-08 VITALS — BP 132/70 | HR 61 | Temp 98.2°F | Resp 18 | Ht 63.0 in | Wt 130.1 lb

## 2016-05-08 DIAGNOSIS — R94120 Abnormal auditory function study: Secondary | ICD-10-CM

## 2016-05-08 DIAGNOSIS — Z0000 Encounter for general adult medical examination without abnormal findings: Secondary | ICD-10-CM

## 2016-05-08 DIAGNOSIS — Z1159 Encounter for screening for other viral diseases: Secondary | ICD-10-CM

## 2016-05-08 DIAGNOSIS — Z23 Encounter for immunization: Secondary | ICD-10-CM | POA: Diagnosis not present

## 2016-05-08 LAB — LIPID PANEL
CHOL/HDL RATIO: 4
Cholesterol: 161 mg/dL (ref 0–200)
HDL: 40.9 mg/dL (ref >39.00)
LDL CALC: 96 mg/dL (ref 0–99)
NonHDL: 119.93
Triglycerides: 118 mg/dL (ref 0.0–149.0)
VLDL: 23.6 mg/dL (ref 0.0–40.0)

## 2016-05-08 LAB — COMPREHENSIVE METABOLIC PANEL
ALT: 19 U/L (ref 0–35)
AST: 18 U/L (ref 0–37)
Albumin: 4.5 g/dL (ref 3.5–5.2)
Alkaline Phosphatase: 85 U/L (ref 39–117)
BUN: 23 mg/dL (ref 6–23)
CHLORIDE: 101 meq/L (ref 96–112)
CO2: 33 meq/L — AB (ref 19–32)
Calcium: 9.6 mg/dL (ref 8.4–10.5)
Creatinine, Ser: 0.71 mg/dL (ref 0.40–1.20)
GFR: 86.56 mL/min (ref >60.00)
Glucose, Bld: 115 mg/dL — ABNORMAL HIGH (ref 70–99)
Potassium: 4.2 mEq/L (ref 3.5–5.1)
SODIUM: 140 meq/L (ref 135–145)
Total Bilirubin: 1 mg/dL (ref 0.2–1.2)
Total Protein: 7.2 g/dL (ref 6.0–8.3)

## 2016-05-08 LAB — CBC WITH DIFFERENTIAL/PLATELET
Basophils Absolute: 0.1 10*3/uL (ref 0.0–0.1)
Basophils Relative: 1.4 % (ref 0.0–3.0)
Eosinophils Absolute: 0.1 10*3/uL (ref 0.0–0.7)
Eosinophils Relative: 2.6 % (ref 0.0–5.0)
HCT: 43.3 % (ref 36.0–46.0)
HEMOGLOBIN: 14.7 g/dL (ref 12.0–15.0)
Lymphocytes Relative: 24.8 % (ref 12.0–46.0)
Lymphs Abs: 1.2 10*3/uL (ref 0.7–4.0)
MCHC: 33.9 g/dL (ref 30.0–36.0)
MCV: 81.7 fl (ref 78.0–100.0)
MONO ABS: 0.5 10*3/uL (ref 0.1–1.0)
MONOS PCT: 10.6 % (ref 3.0–12.0)
Neutro Abs: 3 10*3/uL (ref 1.4–7.7)
Neutrophils Relative %: 60.6 % (ref 43.0–77.0)
Platelets: 202 10*3/uL (ref 150.0–400.0)
RBC: 5.3 Mil/uL — AB (ref 3.87–5.11)
RDW: 14.3 % (ref 11.5–15.5)
WBC: 5 10*3/uL (ref 4.0–10.5)

## 2016-05-08 LAB — TSH: TSH: 0.85 u[IU]/mL (ref 0.35–4.50)

## 2016-05-08 MED ORDER — MELOXICAM 7.5 MG PO TABS: ORAL_TABLET | ORAL | 3 refills | Status: DC

## 2016-05-08 MED ORDER — ZOSTER VAC RECOMB ADJUVANTED 50 MCG/0.5ML IM SUSR: 0.5000 mL | Freq: Once | INTRAMUSCULAR | 1 refills | Status: AC

## 2016-05-08 MED ORDER — CYCLOBENZAPRINE HCL 10 MG PO TABS: 10.0000 mg | ORAL_TABLET | Freq: Three times a day (TID) | ORAL | 1 refills | Status: DC | PRN
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
## 2016-05-08 NOTE — Patient Instructions (Addendum)
Schedule Colonoscopy after 08/01/2016.   Schedule Bone Density test.  Bring a copy of your advance directives to your next office visit.  Continue to eat heart healthy diet (full of fruits, vegetables, whole grains, lean protein, water--limit salt, fat, and sugar intake) and increase physical activity as tolerated.  Continue doing brain stimulating activities (puzzles, reading, adult coloring books, staying active) to keep memory sharp.     Pneumococcal Conjugate Vaccine (PCV13) What You Need to Know 1. Why get vaccinated? Vaccination can protect both children and adults from pneumococcal disease. Pneumococcal disease is caused by bacteria that can spread from person to person through close contact. It can cause ear infections, and it can also lead to more serious infections of the:  Lungs (pneumonia),  Blood (bacteremia), and  Covering of the brain and spinal cord (meningitis). Pneumococcal pneumonia is most common among adults. Pneumococcal meningitis can cause deafness and brain damage, and it kills about 1 child in 10 who get it. Anyone can get pneumococcal disease, but children under 46 years of age and adults 29 years and older, people with certain medical conditions, and cigarette smokers are at the highest risk. Before there was a vaccine, the [place] saw:  more than 700 cases of meningitis,  about 13,000 blood infections,  about 5 million ear infections, and  about 200 deaths in children under 5 each year from pneumococcal disease. Since vaccine became available, severe pneumococcal disease in these children has fallen by 88%. About 18,000 older adults die of pneumococcal disease each year in the [place]. Treatment of pneumococcal infections with penicillin and other drugs is not as effective as it used to be, because some strains of the disease have become resistant to these drugs. This makes prevention of the disease, through vaccination, even more  important. 2. PCV13 vaccine Pneumococcal conjugate vaccine (called PCV13) protects against 13 types of pneumococcal bacteria. PCV13 is routinely given to children at 2, 4, 6, and 28-15 months of age. It is also recommended for children and adults 14 to 62 years of age with certain health conditions, and for all adults 62 years of age and older. Your doctor can give you details. 3. Some people should not get this vaccine Anyone who has ever had a life-threatening allergic reaction to a dose of this vaccine, to an earlier pneumococcal vaccine called PCV7, or to any vaccine containing diphtheria toxoid (for example, DTaP), should not get PCV13. Anyone with a severe allergy to any component of PCV13 should not get the vaccine. Tell your doctor if the person being vaccinated has any severe allergies. If the person scheduled for vaccination is not feeling well, your healthcare provider might decide to reschedule the shot on another day. 4. Risks of a vaccine reaction With any medicine, including vaccines, there is a chance of reactions. These are usually mild and go away on their own, but serious reactions are also possible. Problems reported following PCV13 varied by age and dose in the series. The most common problems reported among children were:  About half became drowsy after the shot, had a temporary loss of appetite, or had redness or tenderness where the shot was given.  About 1 out of 3 had swelling where the shot was given.  About 1 out of 3 had a mild fever, and about 1 in 20 had a fever over 102.51F.  Up to about 8 out of 10 became fussy or irritable. Adults have reported pain, redness, and swelling where the shot was  given; also mild fever, fatigue, headache, chills, or muscle pain. Young children who get PCV13 along with inactivated flu vaccine at the same time may be at increased risk for seizures caused by fever. Ask your doctor for more information. Problems that could happen after any  vaccine:   People sometimes faint after a medical procedure, including vaccination. Sitting or lying down for about 15 minutes can help prevent fainting, and injuries caused by a fall. Tell your doctor if you feel dizzy, or have vision changes or ringing in the ears.  Some older children and adults get severe pain in the shoulder and have difficulty moving the arm where a shot was given. This happens very rarely.  Any medication can cause a severe allergic reaction. Such reactions from a vaccine are very rare, estimated at about 1 in a million doses, and would happen within a few minutes to a few hours after the vaccination. As with any medicine, there is a very small chance of a vaccine causing a serious injury or death. The safety of vaccines is always being monitored. For more information, visit: http://floyd.org/ 5. What if there is a serious reaction? What should I look for?  Look for anything that concerns you, such as signs of a severe allergic reaction, very high fever, or unusual behavior. Signs of a severe allergic reaction can include hives, swelling of the face and throat, difficulty breathing, a fast heartbeat, dizziness, and weakness-usually within a few minutes to a few hours after the vaccination. What should I do?   If you think it is a severe allergic reaction or other emergency that can't wait, call 9-1-1 or get the person to the nearest hospital. Otherwise, call your doctor.  Reactions should be reported to the Vaccine Adverse Event Reporting System (VAERS). Your doctor should file this report, or you can do it yourself through the VAERS web site at www.vaers.LAgents.no, or by calling 1-(937) 566-9169.  VAERS does not give medical advice. 6. The National Vaccine Injury Compensation Program The [employer] Vaccine Injury Compensation Program (VICP) is a federal program that was created to compensate people who may have been injured by certain vaccines. Persons who believe  they may have been injured by a vaccine can learn about the program and about filing a claim by calling 1-970-242-3310 or visiting the VICP website at SpiritualWord.at. There is a time limit to file a claim for compensation. 7. How can I learn more?  Ask your healthcare provider. He or she can give you the vaccine package insert or suggest other sources of information.  Call your local or state health department.  Contact the Centers for Disease Control and Prevention (CDC):  Call 845-210-0749 (1-800-CDC-INFO) or  Visit CDC's website at PicCapture.uy Vaccine Information Statement, PCV13 Vaccine (12/07/2013) This information is not intended to replace advice given to you by your health care provider. Make sure you discuss any questions you have with your health care provider. Document Released: 11/16/2005 Document Revised: 10/10/2015 Document Reviewed: 10/10/2015 Elsevier Interactive Patient Education  2017 [employer].

## 2016-05-08 NOTE — Progress Notes (Signed)
AWV reviewed and agree.  Signed:  Phil McGowen, MD           05/08/2016  

## 2016-05-09 LAB — HEPATITIS C ANTIBODY: HCV AB: NEGATIVE

## 2016-05-11 ENCOUNTER — Encounter: Payer: Self-pay | Admitting: *Deleted

## 2016-07-13 ENCOUNTER — Other Ambulatory Visit: Payer: Self-pay | Admitting: Family Medicine

## 2016-08-03 ENCOUNTER — Ambulatory Visit: Payer: Medicare HMO | Admitting: Audiology

## 2016-08-10 ENCOUNTER — Other Ambulatory Visit: Payer: Self-pay | Admitting: Family Medicine

## 2016-08-10 NOTE — Telephone Encounter (Signed)
[facility]  RF request for meloxicam LOV: 05/08/16 Next ov: 11/06/16 Last written: 05/08/16 #30 w/ 3RF  Please advise. Thanks.

## 2016-10-22 DIAGNOSIS — H2513 Age-related nuclear cataract, bilateral: Secondary | ICD-10-CM | POA: Diagnosis not present

## 2016-10-29 DIAGNOSIS — H43813 Vitreous degeneration, bilateral: Secondary | ICD-10-CM | POA: Diagnosis not present

## 2016-10-29 DIAGNOSIS — H2513 Age-related nuclear cataract, bilateral: Secondary | ICD-10-CM | POA: Diagnosis not present

## 2016-11-06 ENCOUNTER — Encounter: Payer: Self-pay | Admitting: Family Medicine

## 2016-11-06 ENCOUNTER — Ambulatory Visit (INDEPENDENT_AMBULATORY_CARE_PROVIDER_SITE_OTHER): Payer: Medicare HMO | Admitting: Family Medicine

## 2016-11-06 VITALS — BP 128/71 | HR 62 | Temp 98.3°F | Resp 16 | Ht 63.0 in | Wt 130.8 lb

## 2016-11-06 DIAGNOSIS — I10 Essential (primary) hypertension: Secondary | ICD-10-CM

## 2016-11-06 DIAGNOSIS — R7301 Impaired fasting glucose: Secondary | ICD-10-CM

## 2016-11-06 DIAGNOSIS — M150 Primary generalized (osteo)arthritis: Secondary | ICD-10-CM

## 2016-11-06 DIAGNOSIS — Z1211 Encounter for screening for malignant neoplasm of colon: Secondary | ICD-10-CM

## 2016-11-06 DIAGNOSIS — M159 Polyosteoarthritis, unspecified: Secondary | ICD-10-CM

## 2016-11-06 DIAGNOSIS — Z23 Encounter for immunization: Secondary | ICD-10-CM

## 2016-11-06 LAB — HEMOGLOBIN A1C: HEMOGLOBIN A1C: 5.4 % (ref 4.6–6.5)

## 2016-11-06 LAB — BASIC METABOLIC PANEL
BUN: 20 mg/dL (ref 6–23)
CO2: 30 mEq/L (ref 19–32)
Calcium: 9.6 mg/dL (ref 8.4–10.5)
Chloride: 98 mEq/L (ref 96–112)
Creatinine, Ser: 0.68 mg/dL (ref 0.40–1.20)
GFR: 90.86 mL/min (ref >60.00)
GLUCOSE: 128 mg/dL — AB (ref 70–99)
Potassium: 3.8 mEq/L (ref 3.5–5.1)
SODIUM: 137 meq/L (ref 135–145)

## 2016-11-06 NOTE — Addendum Note (Signed)
Addended by: Smitty Knudsen on: 11/06/2016 10:57 AM   Modules accepted: Orders

## 2016-11-06 NOTE — Progress Notes (Signed)
OFFICE VISIT  11/06/2016   CC:  Chief Complaint  Patient presents with  . Follow-up    RCI, pt is not fasting.    HPI:    Patient is a 71 y.o. Caucasian female who presents for 6 mo f/u HTN, IFG, and osteoarthritis of multiple joints.  IFG: Fasting glucoses on BMETs the last 6 yrs have ranged from 100-115.  A1c 2016 5.3%.  HTN: home monitoring consistently 120s/70s.  Exercise: gardening all summer, all yard work as well.  No other exercise.  Trying to eat more veggies lately. Admits to her fair amount of simple carbs.  Osteoarthritis: takes 7.5mg  meloxicam prn--not every day.  Sometimes takes 1/2 of flexeril.  ROS: No CP, no SOB, no dizziness, no palpitations, no polyuria or polydipsia.  No joint swelling or erythema.  Past Medical History:  Diagnosis Date  . Allergic rhinitis    Fall>spring  . Headache(784.0)   . Hypertension   . IFG (impaired fasting glucose)   . Kidney stones    x 1 episode  . Left rib fracture 2017  . Migraines    when "younger"  . Osteoarthritis    Hands, feet, knees: takes meloxicam occas  . Right shoulder injury 12/2014   R AC joint arthritis--steroid injection by ortho/sportsmed MD helped.  Pt also has full thickness RC tear on right.    Past Surgical History:  Procedure Laterality Date  . COLONOSCOPY  08/02/06    Outpatient Medications Prior to Visit  Medication Sig Dispense Refill  . bisoprolol-hydrochlorothiazide (ZIAC) 5-6.25 MG tablet TAKE 2 TABLETS EVERY MORNING  AND TAKE 1 TABLET EVERY EVENING 270 tablet 1  . cyclobenzaprine (FLEXERIL) 10 MG tablet Take 1 tablet (10 mg total) by mouth 3 (three) times daily as needed for muscle spasms. 90 tablet 1  . diphenhydrAMINE (BENADRYL) 25 MG tablet Take 25 mg by mouth every 6 (six) hours as needed.    . flunisolide (NASALIDE) 25 MCG/ACT (0.025%) SOLN Place 2 sprays into the nose 2 (two) times daily. 1 Bottle 12  . meloxicam (MOBIC) 7.5 MG tablet TAKE ONE TABLET BY MOUTH EACH MORNING WITH FOOD  AS NEEDED 30 tablet 6  . Multiple Vitamins-Minerals (WOMENS MULTIVITAMIN PO) Take by mouth.    . Omega-3 Fatty Acids (OMEGA 3 PO) Take 1 tablet by mouth.     No facility-administered medications prior to visit.     Allergies  Allergen Reactions  . Penicillins   . Sulfonamide Derivatives     ROS As per HPI  PE: Blood pressure 128/71, pulse 62, temperature 98.3 F (36.8 C), temperature source Oral, resp. rate 16, height  (1.6 m), weight 130 lb 12 oz (59.3 kg), SpO2 97 %. Gen: Alert, well appearing.  Patient is oriented to person, place, time, and situation. AFFECT: pleasant, lucid thought and speech. CV: RRR, no m/r/g.   LUNGS: CTA bilat, nonlabored resps, good aeration in all lung fields. EXT: no clubbing, cyanosis, or edema.    LABS:  Lab Results  Component Value Date   TSH 0.85 05/08/2016   Lab Results  Component Value Date   WBC 5.0 05/08/2016   HGB 14.7 05/08/2016   HCT 43.3 05/08/2016   MCV 81.7 05/08/2016   PLT 202.0 05/08/2016   Lab Results  Component Value Date   CREATININE 0.71 05/08/2016   BUN 23 05/08/2016   NA 140 05/08/2016   K 4.2 05/08/2016   CL 101 05/08/2016   CO2 33 (H) 05/08/2016   Lab Results  Component Value Date   ALT 19 05/08/2016   AST 18 05/08/2016   ALKPHOS 85 05/08/2016   BILITOT 1.0 05/08/2016   Lab Results  Component Value Date   CHOL 161 05/08/2016   Lab Results  Component Value Date   HDL 40.90 05/08/2016   Lab Results  Component Value Date   LDLCALC 96 05/08/2016   Lab Results  Component Value Date   TRIG 118.0 05/08/2016   Lab Results  Component Value Date   CHOLHDL 4 05/08/2016   Lab Results  Component Value Date   HGBA1C 5.3 07/17/2014   IMPRESSION AND PLAN:  1) HTN; The current medical regimen is effective;  continue present plan and medications. Lytes/cr today.  2) IFG: working some on diet/exercise. BMET (random glucose) and HbA1c today.  3) Osteoarthritis, multiple sites: fairly well  controlled with activity, prn low-dose mobic and prn flexeril.  4) Colon ca screening: Last colonoscopy 2008--normal.   Pt elects to do cologuard today.  Pt understands that if this test returns positive then the next step is colonoscopy.  An After Visit Summary was printed and given to the patient.  FOLLOW UP: Return in about 6 months (around 05/07/2017) for annual CPE (fasting).  Signed:  Santiago Bumpers, MD           11/06/2016

## 2016-11-09 DIAGNOSIS — H2512 Age-related nuclear cataract, left eye: Secondary | ICD-10-CM | POA: Diagnosis not present

## 2016-11-13 DIAGNOSIS — H2510 Age-related nuclear cataract, unspecified eye: Secondary | ICD-10-CM | POA: Insufficient documentation

## 2016-11-17 DIAGNOSIS — K219 Gastro-esophageal reflux disease without esophagitis: Secondary | ICD-10-CM | POA: Diagnosis not present

## 2016-11-17 DIAGNOSIS — H43813 Vitreous degeneration, bilateral: Secondary | ICD-10-CM | POA: Diagnosis not present

## 2016-11-17 DIAGNOSIS — I10 Essential (primary) hypertension: Secondary | ICD-10-CM | POA: Diagnosis not present

## 2016-11-17 DIAGNOSIS — H2513 Age-related nuclear cataract, bilateral: Secondary | ICD-10-CM | POA: Diagnosis not present

## 2016-11-17 DIAGNOSIS — H2512 Age-related nuclear cataract, left eye: Secondary | ICD-10-CM | POA: Diagnosis not present

## 2016-11-17 DIAGNOSIS — Z882 Allergy status to sulfonamides status: Secondary | ICD-10-CM | POA: Diagnosis not present

## 2016-11-17 DIAGNOSIS — M1990 Unspecified osteoarthritis, unspecified site: Secondary | ICD-10-CM | POA: Diagnosis not present

## 2016-12-08 DIAGNOSIS — M1990 Unspecified osteoarthritis, unspecified site: Secondary | ICD-10-CM | POA: Diagnosis not present

## 2016-12-08 DIAGNOSIS — H2511 Age-related nuclear cataract, right eye: Secondary | ICD-10-CM | POA: Diagnosis not present

## 2016-12-08 DIAGNOSIS — Z882 Allergy status to sulfonamides status: Secondary | ICD-10-CM | POA: Diagnosis not present

## 2016-12-08 DIAGNOSIS — Z880 Allergy status to penicillin: Secondary | ICD-10-CM | POA: Diagnosis not present

## 2016-12-08 DIAGNOSIS — K219 Gastro-esophageal reflux disease without esophagitis: Secondary | ICD-10-CM | POA: Diagnosis not present

## 2016-12-08 DIAGNOSIS — H43813 Vitreous degeneration, bilateral: Secondary | ICD-10-CM | POA: Diagnosis not present

## 2016-12-08 DIAGNOSIS — I10 Essential (primary) hypertension: Secondary | ICD-10-CM | POA: Diagnosis not present
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
## 2017-02-08 DIAGNOSIS — Z1231 Encounter for screening mammogram for malignant neoplasm of breast: Secondary | ICD-10-CM | POA: Diagnosis not present

## 2017-02-08 LAB — HM MAMMOGRAPHY

## 2017-02-09 ENCOUNTER — Encounter: Payer: Self-pay | Admitting: Family Medicine

## 2017-02-10 ENCOUNTER — Other Ambulatory Visit: Payer: Self-pay

## 2017-02-10 MED ORDER — MELOXICAM 7.5 MG PO TABS: ORAL_TABLET | ORAL | 1 refills | Status: DC

## 2017-02-10 NOTE — Telephone Encounter (Signed)
Refill request sent

## 2017-03-17 DIAGNOSIS — Z1211 Encounter for screening for malignant neoplasm of colon: Secondary | ICD-10-CM | POA: Diagnosis not present

## 2017-03-17 DIAGNOSIS — Z1212 Encounter for screening for malignant neoplasm of rectum: Secondary | ICD-10-CM | POA: Diagnosis not present

## 2017-03-19 LAB — COLOGUARD: Cologuard: NEGATIVE

## 2017-03-26 ENCOUNTER — Encounter: Payer: Self-pay | Admitting: Family Medicine

## 2017-04-26 ENCOUNTER — Other Ambulatory Visit: Payer: Self-pay | Admitting: *Deleted

## 2017-04-26 MED ORDER — MELOXICAM 7.5 MG PO TABS
ORAL_TABLET | ORAL | 1 refills | Status: DC
Start: 2017-04-26 — End: 2018-04-04

## 2017-04-26 NOTE — Telephone Encounter (Signed)
Pt should still have 1 RF left on her most recent rx done 02/20/17.-thx

## 2017-04-26 NOTE — Addendum Note (Signed)
Addended by: Smitty KnudsenSUTHERLAND, HEATHER K on: 04/26/2017 09:04 AM   Modules accepted: Orders

## 2017-04-26 NOTE — Telephone Encounter (Signed)
[facility]  RF request for meloxicam LOV: 11/06/16 Next ov: 05/10/17 Last written: 02/10/17 #90 w/ 1RF [facility]  Please advise. Thanks.

## 2017-04-26 NOTE — Telephone Encounter (Signed)
SW provider, advised him that [facility], [facility]. Provider voiced understanding and gave v/o to refill Rx and [facility]. [facility] and [facility]ill.

## 2017-05-04 DIAGNOSIS — H26493 Other secondary cataract, bilateral: Secondary | ICD-10-CM | POA: Diagnosis not present

## 2017-05-04 DIAGNOSIS — H43812 Vitreous degeneration, left eye: Secondary | ICD-10-CM | POA: Diagnosis not present

## 2017-05-04 DIAGNOSIS — H524 Presbyopia: Secondary | ICD-10-CM | POA: Diagnosis not present

## 2017-05-04 DIAGNOSIS — H5201 Hypermetropia, right eye: Secondary | ICD-10-CM | POA: Diagnosis not present

## 2017-05-04 DIAGNOSIS — H52223 Regular astigmatism, bilateral: Secondary | ICD-10-CM | POA: Diagnosis not present

## 2017-05-10 ENCOUNTER — Encounter: Payer: Medicare HMO | Admitting: Family Medicine

## 2017-05-27 DIAGNOSIS — H26493 Other secondary cataract, bilateral: Secondary | ICD-10-CM | POA: Diagnosis not present

## 2017-05-27 DIAGNOSIS — H43813 Vitreous degeneration, bilateral: Secondary | ICD-10-CM | POA: Diagnosis not present

## 2017-06-15 ENCOUNTER — Encounter: Payer: Medicare HMO | Admitting: Family Medicine

## 2017-06-25 DIAGNOSIS — H26491 Other secondary cataract, right eye: Secondary | ICD-10-CM | POA: Diagnosis not present

## 2017-07-02 DIAGNOSIS — H26492 Other secondary cataract, left eye: Secondary | ICD-10-CM | POA: Diagnosis not present

## 2017-07-30 ENCOUNTER — Other Ambulatory Visit: Payer: Self-pay | Admitting: Family Medicine

## 2017-07-30 NOTE — Telephone Encounter (Signed)
Copied from CRM [phone]#[id number]. Topic: Quick Communication - See Telephone Encounter >> Jul 30, 2017  9:27 AM Windy KalataMichael, Karimah Winquist L, NT wrote: CRM for notification. See Telephone encounter for: 07/30/17.  Patient is requesting a refill on bisoprolol-hydrochlorothiazide (ZIAC) 5-6.25 MG tablet. She has a med follow up appointment on 08/13/17. Patient is currently out. Please advise.  [id number] [place] [place] [facility] [address] [place] [id number] Phone: [phone] Fax: [phone]

## 2017-07-31 NOTE — Telephone Encounter (Signed)
Bystolic refill Last OV:11/06/16 Last refill:10/15/2015 ZOX:[id number]:[facility]: [phone] [place] [place] - [address] [phone] (Phone) [phone] (Fax)

## 2017-08-02 MED ORDER — BISOPROLOL-HYDROCHLOROTHIAZIDE 5-6.25 MG PO TABS: ORAL_TABLET | ORAL | 0 refills | Status: DC

## 2017-08-02 NOTE — Telephone Encounter (Signed)
SW pt, she stated that she is suppose to take this medication 3 x a day but sometimes she only takes 2 tablets daily.   Advised pt that we will send in Rx.

## 2017-08-13 ENCOUNTER — Encounter: Payer: Self-pay | Admitting: Family Medicine

## 2017-08-13 ENCOUNTER — Ambulatory Visit (INDEPENDENT_AMBULATORY_CARE_PROVIDER_SITE_OTHER): Payer: Medicare HMO | Admitting: Family Medicine

## 2017-08-13 VITALS — BP 122/72 | HR 64 | Temp 98.1°F | Resp 16 | Ht 63.0 in | Wt 133.0 lb

## 2017-08-13 DIAGNOSIS — Z1239 Encounter for other screening for malignant neoplasm of breast: Secondary | ICD-10-CM

## 2017-08-13 DIAGNOSIS — E7800 Pure hypercholesterolemia, unspecified: Secondary | ICD-10-CM

## 2017-08-13 DIAGNOSIS — Z1231 Encounter for screening mammogram for malignant neoplasm of breast: Secondary | ICD-10-CM | POA: Diagnosis not present

## 2017-08-13 DIAGNOSIS — Z124 Encounter for screening for malignant neoplasm of cervix: Secondary | ICD-10-CM

## 2017-08-13 DIAGNOSIS — R7301 Impaired fasting glucose: Secondary | ICD-10-CM

## 2017-08-13 DIAGNOSIS — I10 Essential (primary) hypertension: Secondary | ICD-10-CM | POA: Diagnosis not present

## 2017-08-13 DIAGNOSIS — Z13820 Encounter for screening for osteoporosis: Secondary | ICD-10-CM | POA: Diagnosis not present

## 2017-08-13 LAB — LIPID PANEL
CHOL/HDL RATIO: 4
Cholesterol: 163 mg/dL (ref 0–200)
HDL: 45.1 mg/dL (ref >39.00)
LDL CALC: 88 mg/dL (ref 0–99)
NonHDL: 117.4
TRIGLYCERIDES: 146 mg/dL (ref 0.0–149.0)
VLDL: 29.2 mg/dL (ref 0.0–40.0)

## 2017-08-13 LAB — BASIC METABOLIC PANEL
BUN: 17 mg/dL (ref 6–23)
CHLORIDE: 99 meq/L (ref 96–112)
CO2: 30 meq/L (ref 19–32)
CREATININE: 0.69 mg/dL (ref 0.40–1.20)
Calcium: 9.6 mg/dL (ref 8.4–10.5)
GFR: 89.14 mL/min (ref >60.00)
Glucose, Bld: 101 mg/dL — ABNORMAL HIGH (ref 70–99)
Potassium: 3.9 mEq/L (ref 3.5–5.1)
Sodium: 137 mEq/L (ref 135–145)

## 2017-08-13 LAB — HEMOGLOBIN A1C: HEMOGLOBIN A1C: 5.4 % (ref 4.6–6.5)

## 2017-08-13 MED ORDER — ZOSTER VAC RECOMB ADJUVANTED 50 MCG/0.5ML IM SUSR: 0.5000 mL | Freq: Once | INTRAMUSCULAR | 1 refills | Status: AC
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
## 2017-08-16 ENCOUNTER — Encounter: Payer: Self-pay | Admitting: *Deleted

## 2017-10-27 ENCOUNTER — Other Ambulatory Visit: Payer: Self-pay | Admitting: Family Medicine

## 2017-12-06 ENCOUNTER — Other Ambulatory Visit: Payer: Self-pay

## 2017-12-06 MED ORDER — BISOPROLOL-HYDROCHLOROTHIAZIDE 5-6.25 MG PO TABS: ORAL_TABLET | ORAL | 0 refills | Status: DC

## 2017-12-06 NOTE — Telephone Encounter (Signed)
Patient has follow up appointment 02/2018. Refill request sent to [facility].

## 2018-02-09 DIAGNOSIS — Z1231 Encounter for screening mammogram for malignant neoplasm of breast: Secondary | ICD-10-CM | POA: Diagnosis not present

## 2018-02-09 LAB — HM MAMMOGRAPHY

## 2018-02-10 ENCOUNTER — Encounter: Payer: Self-pay | Admitting: Family Medicine

## 2018-02-15 ENCOUNTER — Encounter: Payer: Medicare HMO | Admitting: Family Medicine

## 2018-04-04 ENCOUNTER — Other Ambulatory Visit: Payer: Self-pay | Admitting: Family Medicine

## 2018-04-04 DIAGNOSIS — H02835 Dermatochalasis of left lower eyelid: Secondary | ICD-10-CM | POA: Diagnosis not present

## 2018-04-04 DIAGNOSIS — H11823 Conjunctivochalasis, bilateral: Secondary | ICD-10-CM | POA: Diagnosis not present

## 2018-04-04 DIAGNOSIS — Z961 Presence of intraocular lens: Secondary | ICD-10-CM | POA: Diagnosis not present

## 2018-04-04 DIAGNOSIS — H02832 Dermatochalasis of right lower eyelid: Secondary | ICD-10-CM | POA: Diagnosis not present

## 2018-04-04 DIAGNOSIS — H02831 Dermatochalasis of right upper eyelid: Secondary | ICD-10-CM | POA: Diagnosis not present

## 2018-04-04 DIAGNOSIS — H02834 Dermatochalasis of left upper eyelid: Secondary | ICD-10-CM | POA: Diagnosis not present

## 2018-04-04 DIAGNOSIS — H43393 Other vitreous opacities, bilateral: Secondary | ICD-10-CM | POA: Diagnosis not present

## 2018-04-04 NOTE — Telephone Encounter (Signed)
RF request for meloxicam LOV: 08/13/17 Next ov: 04/15/18 Last written: 04/26/17 #90 w/ 1RF  Please advise. Thanks.

## 2018-04-15 ENCOUNTER — Encounter: Payer: Medicare HMO | Admitting: Family Medicine

## 2018-05-02 ENCOUNTER — Encounter

## 2018-05-02 ENCOUNTER — Encounter: Payer: Medicare HMO | Admitting: Family Medicine

## 2018-06-15 ENCOUNTER — Encounter: Payer: Medicare HMO | Admitting: Family Medicine

## 2018-07-14 ENCOUNTER — Other Ambulatory Visit: Payer: Self-pay | Admitting: Family Medicine

## 2018-07-14 NOTE — Telephone Encounter (Signed)
Pt was called and has appt 08/26/2018 and would like to keep that appt due to COVID-19. She stated she did cancel in Jan and one time after that due to spouse passing away. Pt was sent in 90 day supply and was told no more medication would be sent to pharmacy until she was seen, pt agreed and verbalized understanding.

## 2018-08-03 DIAGNOSIS — E782 Mixed hyperlipidemia: Secondary | ICD-10-CM

## 2018-08-03 HISTORY — DX: Mixed hyperlipidemia: E78.2

## 2018-08-26 ENCOUNTER — Encounter: Payer: Self-pay | Admitting: Family Medicine

## 2018-08-26 ENCOUNTER — Other Ambulatory Visit: Payer: Self-pay

## 2018-08-26 ENCOUNTER — Ambulatory Visit (INDEPENDENT_AMBULATORY_CARE_PROVIDER_SITE_OTHER): Payer: Medicare HMO | Admitting: Family Medicine

## 2018-08-26 VITALS — BP 123/76 | HR 64 | Temp 96.3°F | Resp 17 | Ht 62.0 in | Wt 132.5 lb

## 2018-08-26 DIAGNOSIS — I10 Essential (primary) hypertension: Secondary | ICD-10-CM

## 2018-08-26 DIAGNOSIS — Z0000 Encounter for general adult medical examination without abnormal findings: Secondary | ICD-10-CM

## 2018-08-26 DIAGNOSIS — R7301 Impaired fasting glucose: Secondary | ICD-10-CM

## 2018-08-26 DIAGNOSIS — Z1239 Encounter for other screening for malignant neoplasm of breast: Secondary | ICD-10-CM | POA: Diagnosis not present

## 2018-08-26 DIAGNOSIS — E782 Mixed hyperlipidemia: Secondary | ICD-10-CM

## 2018-08-26 DIAGNOSIS — E2839 Other primary ovarian failure: Secondary | ICD-10-CM | POA: Diagnosis not present

## 2018-08-26 MED ORDER — ZOSTER VAC RECOMB ADJUVANTED 50 MCG/0.5ML IM SUSR: 0.5000 mL | Freq: Once | INTRAMUSCULAR | 0 refills | Status: AC

## 2018-08-26 NOTE — Patient Instructions (Signed)
Health Maintenance, Female Adopting a healthy lifestyle and getting preventive care are important in promoting health and wellness. Ask your health care provider about:  The right schedule for you to have regular tests and exams.  Things you can do on your own to prevent diseases and keep yourself healthy. What should I know about diet, weight, and exercise? Eat a healthy diet   Eat a diet that includes plenty of vegetables, fruits, low-fat dairy products, and lean protein.  Do not eat a lot of foods that are high in solid fats, added sugars, or sodium. Maintain a healthy weight Body mass index (BMI) is used to identify weight problems. It estimates body fat based on height and weight. Your health care provider can help determine your BMI and help you achieve or maintain a healthy weight. Get regular exercise Get regular exercise. This is one of the most important things you can do for your health. Most adults should:  Exercise for at least 150 minutes each week. The exercise should increase your heart rate and make you sweat (moderate-intensity exercise).  Do strengthening exercises at least twice a week. This is in addition to the moderate-intensity exercise.  Spend less time sitting. Even light physical activity can be beneficial. Watch cholesterol and blood lipids Have your blood tested for lipids and cholesterol at 72 years of age, then have this test every 5 years. Have your cholesterol levels checked more often if:  Your lipid or cholesterol levels are high.  You are older than 72 years of age.  You are at high risk for heart disease. What should I know about cancer screening? Depending on your health history and family history, you may need to have cancer screening at various ages. This may include screening for:  Breast cancer.  Cervical cancer.  Colorectal cancer.  Skin cancer.  Lung cancer. What should I know about heart disease, diabetes, and high blood  pressure? Blood pressure and heart disease  High blood pressure causes heart disease and increases the risk of stroke. This is more likely to develop in people who have high blood pressure readings, are of African descent, or are overweight.  Have your blood pressure checked: ? Every 3-5 years if you are 18-39 years of age. ? Every year if you are 40 years old or older. Diabetes Have regular diabetes screenings. This checks your fasting blood sugar level. Have the screening done:  Once every three years after age 40 if you are at a normal weight and have a low risk for diabetes.  More often and at a younger age if you are overweight or have a high risk for diabetes. What should I know about preventing infection? Hepatitis B If you have a higher risk for hepatitis B, you should be screened for this virus. Talk with your health care provider to find out if you are at risk for hepatitis B infection. Hepatitis C Testing is recommended for:  Everyone born from 1945 through 1965.  Anyone with known risk factors for hepatitis C. Sexually transmitted infections (STIs)  Get screened for STIs, including gonorrhea and chlamydia, if: ? You are sexually active and are younger than 72 years of age. ? You are older than 72 years of age and your health care provider tells you that you are at risk for this type of infection. ? Your sexual activity has changed since you were last screened, and you are at increased risk for chlamydia or gonorrhea. Ask your health care provider if   you are at risk.  Ask your health care provider about whether you are at high risk for HIV. Your health care provider may recommend a prescription medicine to help prevent HIV infection. If you choose to take medicine to prevent HIV, you should first get tested for HIV. You should then be tested every 3 months for as long as you are taking the medicine. Pregnancy  If you are about to stop having your period (premenopausal) and  you may become pregnant, seek counseling before you get pregnant.  Take 400 to 800 micrograms (mcg) of folic acid every day if you become pregnant.  Ask for birth control (contraception) if you want to prevent pregnancy. Osteoporosis and menopause Osteoporosis is a disease in which the bones lose minerals and strength with aging. This can result in bone fractures. If you are 65 years old or older, or if you are at risk for osteoporosis and fractures, ask your health care provider if you should:  Be screened for bone loss.  Take a calcium or vitamin D supplement to lower your risk of fractures.  Be given hormone replacement therapy (HRT) to treat symptoms of menopause. Follow these instructions at home: Lifestyle  Do not use any products that contain nicotine or tobacco, such as cigarettes, e-cigarettes, and chewing tobacco. If you need help quitting, ask your health care provider.  Do not use street drugs.  Do not share needles.  Ask your health care provider for help if you need support or information about quitting drugs. Alcohol use  Do not drink alcohol if: ? Your health care provider tells you not to drink. ? You are pregnant, may be pregnant, or are planning to become pregnant.  If you drink alcohol: ? Limit how much you use to 0-1 drink a day. ? Limit intake if you are breastfeeding.  Be aware of how much alcohol is in your drink. In the [place] one drink equals one 12 oz bottle of beer (355 mL), one 5 oz glass of wine (148 mL), or one 1 oz glass of hard liquor (44 mL). General instructions  Schedule regular health, dental, and eye exams.  Stay current with your vaccines.  Tell your health care provider if: ? You often feel depressed. ? You have ever been abused or do not feel safe at home. Summary  Adopting a healthy lifestyle and getting preventive care are important in promoting health and wellness.  Follow your health care provider's instructions about healthy  diet, exercising, and getting tested or screened for diseases.  Follow your health care provider's instructions on monitoring your cholesterol and blood pressure. This information is not intended to replace advice given to you by your health care provider. Make sure you discuss any questions you have with your health care provider. Document Released: 08/04/2010 Document Revised: 01/12/2018 Document Reviewed: 01/12/2018 Elsevier Patient Education  2020 Elsevier Inc.  

## 2018-08-26 NOTE — Progress Notes (Signed)
Office Note 08/26/2018  CC:  Chief Complaint  Patient presents with  . Annual Exam    fasting. due for bone density study    HPI:  Sandy Gill is a 73 y.o. White female who is here for annual health maintenance exam.   GYN MD-->I referred her to GYN when I last saw her 08/13/2017. She did get an appt, says it got cancelled and she did not make another appt. Also faxed shingrix rx to her pharmacy at that time.  She never got this.  She is feeling well, no acute complaints.  She is appropriately grieving for her husband who died 10-Mar-2018. Home bp monitoring: normal, compliant with meds. She is not watching her diet.  The only exercise she has is weed wacking yard and gardening.   Past Medical History:  Diagnosis Date  . Allergic rhinitis    Fall>spring  . Headache(784.0)   . Hypertension   . IFG (impaired fasting glucose)    HbA1c 5.3%-5.4% 2016-2018  . Kidney stones    x 1 episode  . Left rib fracture 2017  . Migraines    when "younger"  . Osteoarthritis    Hands, feet, knees: takes meloxicam occas  . Right shoulder injury 12/2014   R AC joint arthritis--steroid injection by ortho/sportsmed MD helped.  Pt also has full thickness RC tear on right.    Past Surgical History:  Procedure Laterality Date  . COLONOSCOPY  08/02/06    Family History  Problem Relation Age of Onset  . Arthritis Mother   . Hypertension Mother   . Arthritis Father   . Heart disease Father   . Hypertension Father   . AAA (abdominal aortic aneurysm) Father        age of death 31  . Arthritis Sister   . Hypertension Sister   . Hypertension Other   . Coronary artery disease Other     Social History   Socioeconomic History  . Marital status: Widowed    Spouse name: Not on file  . Number of children: Not on file  . Years of education: Not on file  . Highest education level: Not on file  Occupational History  . Not on file  Social Needs  . Financial resource strain: Not on  file  . Food insecurity    Worry: Not on file    Inability: Not on file  . Transportation needs    Medical: Not on file    Non-medical: Not on file  Tobacco Use  . Smoking status: Never Smoker  . Smokeless tobacco: Never Used  Substance and Sexual Activity  . Alcohol use: No  . Drug use: No  . Sexual activity: Not on file  Lifestyle  . Physical activity    Days per week: Not on file    Minutes per session: Not on file  . Stress: Not on file  Relationships  . Social Herbalist on phone: Not on file    Gets together: Not on file    Attends religious service: Not on file    Active member of club or organization: Not on file    Attends meetings of clubs or organizations: Not on file    Relationship status: Not on file  . Intimate partner violence    Fear of current or ex partner: Not on file    Emotionally abused: Not on file    Physically abused: Not on file    Forced sexual activity: Not  on file  Other Topics Concern  . Not on file  Social History Narrative   Widow (husband Butch d 2020), 1 daughter, 2 grandchildren (grand-daughter lives with her).      Occupation: Advertising account plannerinsurance agent   No T/A/Ds.    Outpatient Medications Prior to Visit  Medication Sig Dispense Refill  . bisoprolol-hydrochlorothiazide (ZIAC) 5-6.25 MG tablet TAKE 2 TABLETS EVERY MORNING AND 1 TABLET EVERY EVENING 270 tablet 0  . cyclobenzaprine (FLEXERIL) 10 MG tablet Take 1 tablet (10 mg total) by mouth 3 (three) times daily as needed for muscle spasms. 90 tablet 1  . diphenhydrAMINE (BENADRYL) 25 MG tablet Take 25 mg by mouth every 6 (six) hours as needed.    . flunisolide (NASALIDE) 25 MCG/ACT (0.025%) SOLN Place 2 sprays into the nose 2 (two) times daily. 1 Bottle 12  . meloxicam (MOBIC) 7.5 MG tablet TAKE 1 TABLET EVERY MORNING WITH FOOD AS NEEDED 90 tablet 1  . Multiple Vitamins-Minerals (WOMENS MULTIVITAMIN PO) Take by mouth.    . Omega-3 Fatty Acids (OMEGA 3 PO) Take 1 tablet by mouth.      No facility-administered medications prior to visit.     Allergies  Allergen Reactions  . Penicillins   . Sulfonamide Derivatives     ROS Review of Systems  Constitutional: Negative for appetite change, chills, fatigue and fever.  HENT: Negative for congestion, dental problem, ear pain and sore throat.   Eyes: Negative for discharge, redness and visual disturbance.  Respiratory: Negative for cough, chest tightness, shortness of breath and wheezing.   Cardiovascular: Negative for chest pain, palpitations and leg swelling.  Gastrointestinal: Negative for abdominal pain, blood in stool, diarrhea, nausea and vomiting.  Genitourinary: Negative for difficulty urinating, dysuria, flank pain, frequency, hematuria and urgency.  Musculoskeletal: Negative for arthralgias, back pain, joint swelling, myalgias and neck stiffness.  Skin: Negative for pallor and rash.  Neurological: Negative for dizziness, speech difficulty, weakness and headaches.  Hematological: Negative for adenopathy. Does not bruise/bleed easily.  Psychiatric/Behavioral: Negative for confusion and sleep disturbance. The patient is not nervous/anxious.     PE; Blood pressure 123/76, pulse 64, temperature (!) 96.3 F (35.7 C), temperature source Temporal, resp. rate 17, height 5\' 2"  (1.575 m), weight 132 lb 8 oz (60.1 kg), SpO2 97 %. Body mass index is 24.23 kg/m. Exam chaperoned by Emi HolesBrittanae Staton, CMA.  Gen: Alert, well appearing.  Patient is oriented to person, place, time, and situation. AFFECT: pleasant, lucid thought and speech. ENT: Ears: EACs clear, normal epithelium.  TMs with good light reflex and landmarks bilaterally.  Eyes: no injection, icteris, swelling, or exudate.  EOMI, PERRLA. Nose: no drainage or turbinate edema/swelling.  No injection or focal lesion.  Mouth: lips without lesion/swelling.  Oral mucosa pink and moist.  Dentition intact and without obvious caries or gingival swelling.  Oropharynx without  erythema, exudate, or swelling.  Neck: supple/nontender.  No LAD, mass, or TM.  Carotid pulses 2+ bilaterally, without bruits. CV: RRR, no m/r/g.   LUNGS: CTA bilat, nonlabored resps, good aeration in all lung fields. ABD: soft, NT, ND, BS normal.  No hepatospenomegaly or mass.  No bruits. EXT: no clubbing, cyanosis, or edema.  Musculoskeletal: no joint swelling, erythema, warmth, or tenderness.  ROM of all joints intact. Skin - no sores or suspicious lesions or rashes or color changes   Pertinent labs:  Lab Results  Component Value Date   TSH 0.85 05/08/2016   Lab Results  Component Value Date   WBC 5.0  05/08/2016   HGB 14.7 05/08/2016   HCT 43.3 05/08/2016   MCV 81.7 05/08/2016   PLT 202.0 05/08/2016   Lab Results  Component Value Date   CREATININE 0.69 08/13/2017   BUN 17 08/13/2017   NA 137 08/13/2017   K 3.9 08/13/2017   CL 99 08/13/2017   CO2 30 08/13/2017   Lab Results  Component Value Date   ALT 19 05/08/2016   AST 18 05/08/2016   ALKPHOS 85 05/08/2016   BILITOT 1.0 05/08/2016   Lab Results  Component Value Date   CHOL 163 08/13/2017   Lab Results  Component Value Date   HDL 45.10 08/13/2017   Lab Results  Component Value Date   LDLCALC 88 08/13/2017   Lab Results  Component Value Date   TRIG 146.0 08/13/2017   Lab Results  Component Value Date   CHOLHDL 4 08/13/2017   Lab Results  Component Value Date   HGBA1C 5.4 08/13/2017    ASSESSMENT AND PLAN:   Health maintenance exam: Reviewed age and gender appropriate health maintenance issues (prudent diet, regular exercise, health risks of tobacco and excessive alcohol, use of seatbelts, fire alarms in home, use of sunscreen).  Also reviewed age and gender appropriate health screening as well as vaccine recommendations. Vaccines:all UTD.  Shingrix discussed-->we'll fax another rx to her pharmacy. Labs: CBC, CMET, FLP, A1c (IFG). Cervical ca screening: she'll make contact with GYN that I  referred her to last year. Breast ca screening: next mammogram due 02/2019-ordered. Colon ca screening: cologuard neg 03/2017.  Plan repeat 03/2020. Osteoporosis screening: DEXA testing discussed-->will order this and get it done around the time of next mammogram 02/2019.  An After Visit Summary was printed and given to the patient.  FOLLOW UP:  Return in about 6 months (around 02/26/2019) for routine chronic illness f/u.do rpt cologuard at that time.  Signed:  Santiago BumpersPhil Viridiana Spaid, MD           08/26/2018

## 2018-08-27 ENCOUNTER — Encounter: Payer: Self-pay | Admitting: Family Medicine

## 2018-08-27 LAB — CBC WITH DIFFERENTIAL/PLATELET
Absolute Monocytes: 696 cells/uL (ref 200–950)
Basophils Absolute: 60 cells/uL (ref 0–200)
Basophils Relative: 1 %
Eosinophils Absolute: 60 cells/uL (ref 15–500)
Eosinophils Relative: 1 %
HCT: 42.6 % (ref 35.0–45.0)
Hemoglobin: 14.7 g/dL (ref 11.7–15.5)
Lymphs Abs: 996 cells/uL (ref 850–3900)
MCH: 28.2 pg (ref 27.0–33.0)
MCHC: 34.5 g/dL (ref 32.0–36.0)
MCV: 81.8 fL (ref 80.0–100.0)
MPV: 11.2 fL (ref 7.5–12.5)
Monocytes Relative: 11.6 %
Neutro Abs: 4188 cells/uL (ref 1500–7800)
Neutrophils Relative %: 69.8 %
Platelets: 225 10*3/uL (ref 140–400)
RBC: 5.21 10*6/uL — ABNORMAL HIGH (ref 3.80–5.10)
RDW: 13.3 % (ref 11.0–15.0)
Total Lymphocyte: 16.6 %
WBC: 6 10*3/uL (ref 3.8–10.8)

## 2018-08-27 LAB — COMPREHENSIVE METABOLIC PANEL
AG Ratio: 1.7 (calc) (ref 1.0–2.5)
ALT: 19 U/L (ref 6–29)
AST: 22 U/L (ref 10–35)
Albumin: 4.6 g/dL (ref 3.6–5.1)
Alkaline phosphatase (APISO): 65 U/L (ref 37–153)
BUN: 18 mg/dL (ref 7–25)
CO2: 30 mmol/L (ref 20–32)
Calcium: 9.9 mg/dL (ref 8.6–10.4)
Chloride: 99 mmol/L (ref 98–110)
Creat: 0.71 mg/dL (ref 0.60–0.93)
Globulin: 2.7 g/dL (calc) (ref 1.9–3.7)
Glucose, Bld: 101 mg/dL — ABNORMAL HIGH (ref 65–99)
Potassium: 3.9 mmol/L (ref 3.5–5.3)
Sodium: 138 mmol/L (ref 135–146)
Total Bilirubin: 1.2 mg/dL (ref 0.2–1.2)
Total Protein: 7.3 g/dL (ref 6.1–8.1)

## 2018-08-27 LAB — LIPID PANEL
Cholesterol: 184 mg/dL (ref <200)
HDL: 42 mg/dL — ABNORMAL LOW (ref >=50)
LDL Cholesterol (Calc): 106 mg/dL (calc) — ABNORMAL HIGH
Non-HDL Cholesterol (Calc): 142 mg/dL (calc) — ABNORMAL HIGH (ref <130)
Total CHOL/HDL Ratio: 4.4 (calc) (ref <5.0)
Triglycerides: 235 mg/dL — ABNORMAL HIGH (ref <150)

## 2018-08-27 LAB — HEMOGLOBIN A1C
Hgb A1c MFr Bld: 5.4 % of total Hgb (ref <5.7)
Mean Plasma Glucose: 108 (calc)
eAG (mmol/L): 6 (calc)

## 2018-08-31 MED ORDER — ATORVASTATIN CALCIUM 20 MG PO TABS: 20.0000 mg | ORAL_TABLET | Freq: Every day | ORAL | 2 refills | Status: DC

## 2018-08-31 NOTE — Addendum Note (Signed)
Addended by: Marlene Bast A on: 08/31/2018 09:26 AM   Modules accepted: Orders

## 2018-10-24 ENCOUNTER — Other Ambulatory Visit: Payer: Self-pay | Admitting: Family Medicine

## 2018-10-24 ENCOUNTER — Telehealth: Payer: Self-pay | Admitting: Family Medicine

## 2018-10-24 MED ORDER — BISOPROLOL-HYDROCHLOROTHIAZIDE 5-6.25 MG PO TABS: ORAL_TABLET | ORAL | 0 refills | Status: DC

## 2018-10-24 NOTE — Telephone Encounter (Signed)
I'll do this rx. Pls route these type of requests that you get over the phone to a CMA b/c they'll be able to put the RF request in so I can do it simply via EPIC Rx request rather than doing an addendum to your phone note. Thank you!

## 2018-10-24 NOTE — Telephone Encounter (Signed)
Please refill   bisoprolol-hydrochlorothiazide (ZIAC) 5-6.25 MG tablet [[id number]  X 10 days only - she will pay cash out of pocket   [facility] [place]

## 2018-10-25 NOTE — Telephone Encounter (Signed)
My error in routing refill request, forward to [place],

## 2018-11-01 ENCOUNTER — Ambulatory Visit: Payer: Medicare HMO

## 2018-11-11 ENCOUNTER — Other Ambulatory Visit: Payer: Self-pay

## 2018-11-11 DIAGNOSIS — E782 Mixed hyperlipidemia: Secondary | ICD-10-CM

## 2018-11-14 ENCOUNTER — Ambulatory Visit: Payer: Medicare HMO

## 2018-11-23 ENCOUNTER — Other Ambulatory Visit: Payer: Self-pay | Admitting: Family Medicine

## 2018-11-30 ENCOUNTER — Other Ambulatory Visit: Payer: Self-pay | Admitting: Family Medicine

## 2019-02-07 DIAGNOSIS — H52222 Regular astigmatism, left eye: Secondary | ICD-10-CM | POA: Diagnosis not present

## 2019-02-07 DIAGNOSIS — H5203 Hypermetropia, bilateral: Secondary | ICD-10-CM | POA: Diagnosis not present

## 2019-02-07 DIAGNOSIS — Z961 Presence of intraocular lens: Secondary | ICD-10-CM | POA: Diagnosis not present

## 2019-02-07 DIAGNOSIS — H524 Presbyopia: Secondary | ICD-10-CM | POA: Diagnosis not present

## 2019-02-16 ENCOUNTER — Other Ambulatory Visit: Payer: Medicare HMO

## 2019-02-16 ENCOUNTER — Ambulatory Visit: Payer: Medicare HMO

## 2019-02-27 ENCOUNTER — Encounter: Payer: Self-pay | Admitting: Family Medicine

## 2019-02-27 ENCOUNTER — Other Ambulatory Visit: Payer: Self-pay

## 2019-02-27 ENCOUNTER — Ambulatory Visit (INDEPENDENT_AMBULATORY_CARE_PROVIDER_SITE_OTHER): Payer: Medicare HMO | Admitting: Family Medicine

## 2019-02-27 VITALS — BP 125/84

## 2019-02-27 DIAGNOSIS — E7800 Pure hypercholesterolemia, unspecified: Secondary | ICD-10-CM | POA: Diagnosis not present

## 2019-02-27 DIAGNOSIS — R7301 Impaired fasting glucose: Secondary | ICD-10-CM | POA: Diagnosis not present

## 2019-02-27 DIAGNOSIS — I10 Essential (primary) hypertension: Secondary | ICD-10-CM

## 2019-02-27 NOTE — Progress Notes (Signed)
Virtual Visit via Video Note  I connected with Sandy Gill on 02/27/19 at  1:00 PM EST by a video enabled telemedicine application and verified that I am speaking with the correct person using two identifiers.  Location patient: home Location provider:work or home office Persons participating in the virtual visit: patient, provider  I discussed the limitations of evaluation and management by telemedicine and the availability of in person appointments. The patient expressed understanding and agreed to proceed.  Telemedicine visit is a necessity given the COVID-19 restrictions in place at the current time.  HPI: 74 y/o WF being seen today for 6 mo f/u HTN, HLD, and IFG.  HTN: home bp consistently < 130/80.  Compliant with med. Bp at eye md recently 121/81.  HLD: Sandy Gill was ok with starting atorvastatin 08/2018 so this med was rx'd. She felt like lot of ingestion of eggs prior to last labs may have caused elev chole so she decided to make some dietary adjustments, decided not to try the med yet. She has made good dietary changes, currently doing stretching exercises only.  No cardio.  IFG: working on diet/exercise.  Denies polyuria or polydipsia.  ROS: no CP, no SOB, no wheezing, no cough, no dizziness, no HAs, no rashes, no melena/hematochezia.  No polyuria or polydipsia.  No myalgias or arthralgias.   Past Medical History:  Diagnosis Date  . Allergic rhinitis    Fall>spring  . Headache(784.0)   . Hyperlipemia, mixed 08/2018   Statin recommended 08/2018.  Marland Kitchen Hypertension   . IFG (impaired fasting glucose)    HbA1c 5.3%-5.4% 2016-2018  . Kidney stones    x 1 episode  . Left rib fracture 2017  . Migraines    when "younger"  . Osteoarthritis    Hands, feet, knees: takes meloxicam occas  . Right shoulder injury 12/2014   R AC joint arthritis--steroid injection by ortho/sportsmed MD helped.  Sandy Gill also has full thickness RC tear on right.    Past Surgical History:  Procedure Laterality Date  .  COLONOSCOPY  08/02/06    Family History  Problem Relation Age of Onset  . Arthritis Mother   . Hypertension Mother   . Arthritis Father   . Heart disease Father   . Hypertension Father   . AAA (abdominal aortic aneurysm) Father        age of death 27  . Arthritis Sister   . Hypertension Sister   . Hypertension Other   . Coronary artery disease Other      Current Outpatient Medications:  .  bisoprolol-hydrochlorothiazide (ZIAC) 5-6.25 MG tablet, TAKE 2 TABLETS EVERY MORNING AND TAKE 1 TABLET EVERY EVENING, Disp: 90 tablet, Rfl: 1 .  Cetirizine HCl (ZYRTEC PO), Take by mouth as needed., Disp: , Rfl:  .  cyclobenzaprine (FLEXERIL) 10 MG tablet, Take 1 tablet (10 mg total) by mouth 3 (three) times daily as needed for muscle spasms., Disp: 90 tablet, Rfl: 1 .  diphenhydrAMINE (BENADRYL) 25 MG tablet, Take 25 mg by mouth every 6 (six) hours as needed., Disp: , Rfl:  .  flunisolide (NASALIDE) 25 MCG/ACT (0.025%) SOLN, Place 2 sprays into the nose 2 (two) times daily., Disp: 1 Bottle, Rfl: 12 .  Loratadine (CLARITIN PO), Take by mouth as needed., Disp: , Rfl:  .  meloxicam (MOBIC) 7.5 MG tablet, TAKE 1 TABLET EVERY MORNING WITH FOOD AS NEEDED, Disp: 90 tablet, Rfl: 1 .  Multiple Vitamins-Minerals (WOMENS MULTIVITAMIN PO), Take by mouth., Disp: , Rfl:  .  Omega-3  Fatty Acids (OMEGA 3 PO), Take 1 tablet by mouth., Disp: , Rfl:  .  atorvastatin (LIPITOR) 20 MG tablet, Take 1 tablet (20 mg total) by mouth daily. (Patient not taking: Reported on 02/27/2019), Disp: 30 tablet, Rfl: 2  EXAM:  VITALS per patient if applicable: BP 125/84 (BP Location: Left Arm, Patient Position: Sitting, Cuff Size: Normal)    GENERAL: alert, oriented, appears well and in no acute distress  HEENT: atraumatic, conjunttiva clear, no obvious abnormalities on inspection of external nose and ears  NECK: normal movements of the head and neck  LUNGS: on inspection no signs of respiratory distress, breathing rate  appears normal, no obvious gross SOB, gasping or wheezing  CV: no obvious cyanosis  MS: moves all visible extremities without noticeable abnormality  PSYCH/NEURO: pleasant and cooperative, no obvious depression or anxiety, speech and thought processing grossly intact  LABS: none today  Lab Results  Component Value Date   TSH 0.85 05/08/2016   Lab Results  Component Value Date   WBC 6.0 08/26/2018   HGB 14.7 08/26/2018   HCT 42.6 08/26/2018   MCV 81.8 08/26/2018   PLT 225 08/26/2018   Lab Results  Component Value Date   CREATININE 0.71 08/26/2018   BUN 18 08/26/2018   NA 138 08/26/2018   K 3.9 08/26/2018   CL 99 08/26/2018   CO2 30 08/26/2018   Lab Results  Component Value Date   ALT 19 08/26/2018   AST 22 08/26/2018   ALKPHOS 85 05/08/2016   BILITOT 1.2 08/26/2018   Lab Results  Component Value Date   CHOL 184 08/26/2018   Lab Results  Component Value Date   HDL 42 (L) 08/26/2018   Lab Results  Component Value Date   LDLCALC 106 (H) 08/26/2018   Lab Results  Component Value Date   TRIG 235 (H) 08/26/2018   Lab Results  Component Value Date   CHOLHDL 4.4 08/26/2018   Lab Results  Component Value Date   HGBA1C 5.4 08/26/2018    ASSESSMENT AND PLAN:  Discussed the following assessment and plan:  1) HTN: The current medical regimen is effective;  continue present plan and medications. Lytes/cr 08/2018 great. Plan recheck labs at CPE in 6 mo.  2) HLD: Sandy Gill has made good dietary changes since last lipids. She is agreeable to trial of atorvastatin if numbers not signif improved-->lab visit for recheck FLP at her earliest convenience.  3) IFG: she is avoiding simple sugars, minimizing high starch foods, eats lots of fruits/veg. Plan recheck fasting gluc and Hba1c 6 mo.   Last A1c 08/2018 excellent->5.4%.  I discussed the assessment and treatment plan with the patient. The patient was provided an opportunity to ask questions and all were answered. The  patient agreed with the plan and demonstrated an understanding of the instructions.   The patient was advised to call back or seek an in-person evaluation if the symptoms worsen or if the condition fails to improve as anticipated.  F/u: lab visit at earliest conv, CPE 6 mo  Signed:  Santiago Bumpers, MD           02/27/2019

## 2019-03-09 ENCOUNTER — Other Ambulatory Visit: Payer: Self-pay

## 2019-03-09 ENCOUNTER — Ambulatory Visit (INDEPENDENT_AMBULATORY_CARE_PROVIDER_SITE_OTHER): Payer: Medicare HMO | Admitting: Family Medicine

## 2019-03-09 DIAGNOSIS — E7800 Pure hypercholesterolemia, unspecified: Secondary | ICD-10-CM

## 2019-03-09 LAB — LIPID PANEL
Cholesterol: 178 mg/dL (ref 0–200)
HDL: 41.3 mg/dL (ref >39.00)
NonHDL: 136.87
Total CHOL/HDL Ratio: 4
Triglycerides: 254 mg/dL — ABNORMAL HIGH (ref 0.0–149.0)
VLDL: 50.8 mg/dL — ABNORMAL HIGH (ref 0.0–40.0)

## 2019-03-09 LAB — LDL CHOLESTEROL, DIRECT: Direct LDL: 91 mg/dL

## 2019-03-14 ENCOUNTER — Other Ambulatory Visit: Payer: Self-pay

## 2019-03-14 DIAGNOSIS — E782 Mixed hyperlipidemia: Secondary | ICD-10-CM

## 2019-03-14 MED ORDER — ATORVASTATIN CALCIUM 20 MG PO TABS: 20.0000 mg | ORAL_TABLET | Freq: Every day | ORAL | 2 refills | Status: DC

## 2019-04-18 ENCOUNTER — Telehealth: Payer: Self-pay

## 2019-04-18 NOTE — Telephone Encounter (Signed)
Imaging place, [facility] would like to know reason for bone density and PCP to sign off on order. Placed on PCP desk to review and sign, if appropriate.

## 2019-04-19 NOTE — Telephone Encounter (Signed)
Placed in bin to be faxed up front.

## 2019-04-19 NOTE — Telephone Encounter (Signed)
Signed, handed to you.

## 2019-06-03 DIAGNOSIS — M8580 Other specified disorders of bone density and structure, unspecified site: Secondary | ICD-10-CM

## 2019-06-03 HISTORY — DX: Other specified disorders of bone density and structure, unspecified site: M85.80

## 2019-06-03 HISTORY — PX: OTHER SURGICAL HISTORY: SHX169

## 2019-06-08 ENCOUNTER — Encounter: Payer: Self-pay | Admitting: Family Medicine

## 2019-06-08 DIAGNOSIS — Z780 Asymptomatic menopausal state: Secondary | ICD-10-CM | POA: Diagnosis not present

## 2019-06-08 DIAGNOSIS — M85851 Other specified disorders of bone density and structure, right thigh: Secondary | ICD-10-CM | POA: Diagnosis not present

## 2019-06-08 DIAGNOSIS — M85852 Other specified disorders of bone density and structure, left thigh: Secondary | ICD-10-CM | POA: Diagnosis not present

## 2019-06-08 DIAGNOSIS — Z1231 Encounter for screening mammogram for malignant neoplasm of breast: Secondary | ICD-10-CM | POA: Diagnosis not present

## 2019-06-13 ENCOUNTER — Telehealth: Payer: Self-pay

## 2019-06-13 NOTE — Telephone Encounter (Signed)
Received fax from [facility] asking PCP to sign off on additional imaging. Placed on PCP desk to review and sign, if appropriate.

## 2019-06-14 ENCOUNTER — Other Ambulatory Visit: Payer: Self-pay | Admitting: Family Medicine

## 2019-06-14 ENCOUNTER — Other Ambulatory Visit: Payer: Self-pay

## 2019-06-14 ENCOUNTER — Telehealth: Payer: Self-pay

## 2019-06-14 MED ORDER — BISOPROLOL-HYDROCHLOROTHIAZIDE 5-6.25 MG PO TABS: ORAL_TABLET | ORAL | 0 refills | Status: DC

## 2019-06-14 NOTE — Telephone Encounter (Signed)
Signed and put in box to go up front. Signed:  Phil Kerrianne Jeng, MD           06/14/2019  

## 2019-06-14 NOTE — Telephone Encounter (Signed)
Refill request - patient is out of meds --- please [facility] [place] Then,send 90 d/s to mail order pharmacy Thank you    bisoprolol-hydrochlorothiazide [facility]) 5-6.25 MG tablet [[id number]]    [facility] weeks - [phone] [facility] [place]   Mail Order Pharmacy - [facility] - [place], [place] - [address]

## 2019-06-14 NOTE — Telephone Encounter (Signed)
2 week supply sent to local and 90 d supply to mail order. LM for pt to return call.

## 2019-06-15 DIAGNOSIS — R928 Other abnormal and inconclusive findings on diagnostic imaging of breast: Secondary | ICD-10-CM | POA: Diagnosis not present

## 2019-06-15 LAB — HM MAMMOGRAPHY

## 2019-06-15 NOTE — Telephone Encounter (Signed)
Left detailed message advising medication sent for 2 week supply to local and 90 d supply to mail order. Okay per dpr

## 2019-06-16 ENCOUNTER — Telehealth: Payer: Self-pay

## 2019-06-16 ENCOUNTER — Encounter: Payer: Self-pay | Admitting: Family Medicine

## 2019-06-16 NOTE — Telephone Encounter (Addendum)
Received results from pt's mammogram/ bone density. Placed on PCP desk for review

## 2019-06-29 NOTE — Telephone Encounter (Signed)
Bone density testing showed thin bones but NOT to the level of osteoporosis. Continue vit D and calcium supplement. Plan repeat bone density test in 2 yrs.  Mammogram NORMAL. Repeat 1 yr.

## 2019-06-29 NOTE — Telephone Encounter (Signed)
Patient advised and voiced understanding.  

## 2019-12-27 ENCOUNTER — Telehealth: Payer: Self-pay | Admitting: Family Medicine

## 2019-12-27 NOTE — Telephone Encounter (Signed)
Pt needs refills on meds sent to [facility] (they are asking for new rx's). Was supposed to come in for a CPE In June/July but did not sch, I have her scheduled for 02/01/20 at 9am.   Bisoprolol 5/6.25 and meloxicam 7.5  States you did not rx the meloxicam last time because she only uses PRN but with the cold weather she is using more.

## 2020-01-04 ENCOUNTER — Other Ambulatory Visit: Payer: Self-pay | Admitting: Family Medicine

## 2020-01-04 ENCOUNTER — Other Ambulatory Visit: Payer: Self-pay

## 2020-01-04 MED ORDER — BISOPROLOL-HYDROCHLOROTHIAZIDE 5-6.25 MG PO TABS: ORAL_TABLET | ORAL | 0 refills | Status: DC

## 2020-01-04 NOTE — Telephone Encounter (Signed)
Patient called to check status of this refill. States she will run out of medication tomorrow and would like at least 2 weeks supply. Please send to [facility] [address], in [place].

## 2020-01-30 ENCOUNTER — Encounter: Payer: Medicare HMO | Admitting: Family Medicine

## 2020-02-01 ENCOUNTER — Encounter: Payer: Self-pay | Admitting: Family Medicine

## 2020-02-01 ENCOUNTER — Other Ambulatory Visit: Payer: Self-pay

## 2020-02-01 ENCOUNTER — Ambulatory Visit (INDEPENDENT_AMBULATORY_CARE_PROVIDER_SITE_OTHER): Payer: Medicare HMO | Admitting: Family Medicine

## 2020-02-01 VITALS — BP 111/70 | HR 68 | Temp 97.9°F | Resp 16 | Ht 62.0 in | Wt 133.2 lb

## 2020-02-01 DIAGNOSIS — Z1231 Encounter for screening mammogram for malignant neoplasm of breast: Secondary | ICD-10-CM | POA: Diagnosis not present

## 2020-02-01 DIAGNOSIS — E7800 Pure hypercholesterolemia, unspecified: Secondary | ICD-10-CM

## 2020-02-01 DIAGNOSIS — R7301 Impaired fasting glucose: Secondary | ICD-10-CM | POA: Diagnosis not present

## 2020-02-01 DIAGNOSIS — I10 Essential (primary) hypertension: Secondary | ICD-10-CM | POA: Diagnosis not present

## 2020-02-01 DIAGNOSIS — Z1211 Encounter for screening for malignant neoplasm of colon: Secondary | ICD-10-CM

## 2020-02-01 DIAGNOSIS — E2839 Other primary ovarian failure: Secondary | ICD-10-CM | POA: Diagnosis not present

## 2020-02-01 DIAGNOSIS — Z13820 Encounter for screening for osteoporosis: Secondary | ICD-10-CM

## 2020-02-01 DIAGNOSIS — Z0000 Encounter for general adult medical examination without abnormal findings: Secondary | ICD-10-CM

## 2020-02-01 LAB — CBC WITH DIFFERENTIAL/PLATELET
Basophils Absolute: 0 10*3/uL (ref 0.0–0.1)
Basophils Relative: 0.5 % (ref 0.0–3.0)
Eosinophils Absolute: 0 10*3/uL (ref 0.0–0.7)
Eosinophils Relative: 0.3 % (ref 0.0–5.0)
HCT: 41 % (ref 36.0–46.0)
Hemoglobin: 14 g/dL (ref 12.0–15.0)
Lymphocytes Relative: 8.5 % — ABNORMAL LOW (ref 12.0–46.0)
Lymphs Abs: 0.7 10*3/uL (ref 0.7–4.0)
MCHC: 34.2 g/dL (ref 30.0–36.0)
MCV: 81.6 fl (ref 78.0–100.0)
Monocytes Absolute: 0.7 10*3/uL (ref 0.1–1.0)
Monocytes Relative: 8.6 % (ref 3.0–12.0)
Neutro Abs: 7.1 10*3/uL (ref 1.4–7.7)
Neutrophils Relative %: 82.1 % — ABNORMAL HIGH (ref 43.0–77.0)
Platelets: 186 10*3/uL (ref 150.0–400.0)
RBC: 5.03 Mil/uL (ref 3.87–5.11)
RDW: 14.1 % (ref 11.5–15.5)
WBC: 8.7 10*3/uL (ref 4.0–10.5)

## 2020-02-01 LAB — COMPREHENSIVE METABOLIC PANEL
ALT: 21 U/L (ref 0–35)
AST: 18 U/L (ref 0–37)
Albumin: 4.6 g/dL (ref 3.5–5.2)
Alkaline Phosphatase: 72 U/L (ref 39–117)
BUN: 18 mg/dL (ref 6–23)
CO2: 30 mEq/L (ref 19–32)
Calcium: 9.3 mg/dL (ref 8.4–10.5)
Chloride: 100 mEq/L (ref 96–112)
Creatinine, Ser: 0.68 mg/dL (ref 0.40–1.20)
GFR: 86.37 mL/min (ref >60.00)
Glucose, Bld: 112 mg/dL — ABNORMAL HIGH (ref 70–99)
Potassium: 3.5 mEq/L (ref 3.5–5.1)
Sodium: 137 mEq/L (ref 135–145)
Total Bilirubin: 1.8 mg/dL — ABNORMAL HIGH (ref 0.2–1.2)
Total Protein: 7.1 g/dL (ref 6.0–8.3)

## 2020-02-01 LAB — LIPID PANEL
Cholesterol: 174 mg/dL (ref 0–200)
HDL: 46.9 mg/dL (ref >39.00)
LDL Cholesterol: 98 mg/dL (ref 0–99)
NonHDL: 126.91
Total CHOL/HDL Ratio: 4
Triglycerides: 143 mg/dL (ref 0.0–149.0)
VLDL: 28.6 mg/dL (ref 0.0–40.0)

## 2020-02-01 LAB — HEMOGLOBIN A1C: Hgb A1c MFr Bld: 5.6 % (ref 4.6–6.5)

## 2020-02-01 MED ORDER — MELOXICAM 7.5 MG PO TABS: ORAL_TABLET | ORAL | 1 refills | Status: DC

## 2020-02-01 MED ORDER — BISOPROLOL-HYDROCHLOROTHIAZIDE 5-6.25 MG PO TABS
ORAL_TABLET | ORAL | 3 refills | Status: DC
Start: 2020-02-01 — End: 2020-02-23

## 2020-02-01 MED ORDER — PRAVASTATIN SODIUM 20 MG PO TABS: 20.0000 mg | ORAL_TABLET | Freq: Every day | ORAL | 2 refills | Status: DC

## 2020-02-01 MED ORDER — CYCLOBENZAPRINE HCL 10 MG PO TABS
10.0000 mg | ORAL_TABLET | Freq: Three times a day (TID) | ORAL | 1 refills | Status: DC | PRN
Start: 2020-02-01 — End: 2020-02-23

## 2020-02-01 NOTE — Progress Notes (Signed)
Office Note 02/01/2020  CC:  Chief Complaint  Patient presents with  . Annual Exam    Pt is fasting    HPI:  Sandy Gill is a 74 y.o. White female who is here for annual health maintenance exam and f/u HTN, HLD, and IFG.  Atorva trial x 3d caused bad upper abd discomfort and nausea + vomited. She's up for trying different statin.  Home bp's normal consistently taking 2 ziac 5-6.25 tabs qAM and 1 qpm.  Walking regularly for exercise.   She's working on her diet, less complex carbs.  She does benefit from taking meloxicam and flexeril prn for recurrent neck and shoulders aching. NOT regularly taking these meds though.  Past Medical History:  Diagnosis Date  . Allergic rhinitis    Fall>spring  . Headache(784.0)   . Hyperlipemia, mixed 08/2018   Statin recommended 08/2018.  Marland Kitchen Hypertension   . IFG (impaired fasting glucose)    HbA1c 5.3%-5.4% 2016-2018  . Kidney stones    x 1 episode  . Left rib fracture 2017  . Migraines    when "younger"  . Osteoarthritis    Hands, feet, knees: takes meloxicam occas  . Osteopenia 06/2019   T score -2.0.  Plan rpt 06/2021.  . Right shoulder injury 12/2014   R AC joint arthritis--steroid injection by ortho/sportsmed MD helped.  Pt also has full thickness RC tear on right.    Past Surgical History:  Procedure Laterality Date  . COLONOSCOPY  08/02/06  . DEXA  06/2019   T score -2.0. Rpt 2 yrs    Family History  Problem Relation Age of Onset  . Arthritis Mother   . Hypertension Mother   . Arthritis Father   . Heart disease Father   . Hypertension Father   . AAA (abdominal aortic aneurysm) Father        age of death 32  . Arthritis Sister   . Hypertension Sister   . Hypertension Other   . Coronary artery disease Other     Social History   Socioeconomic History  . Marital status: Widowed    Spouse name: Not on file  . Number of children: Not on file  . Years of education: Not on file  . Highest education  level: Not on file  Occupational History  . Not on file  Tobacco Use  . Smoking status: Never Smoker  . Smokeless tobacco: Never Used  Substance and Sexual Activity  . Alcohol use: No  . Drug use: No  . Sexual activity: Not on file  Other Topics Concern  . Not on file  Social History Narrative   Widow (husband Butch d 2020), 1 daughter, 2 grandchildren (grand-daughter lives with her).      Occupation: Advertising account planner   No T/A/Ds.   Social Determinants of Health   Financial Resource Strain: Not on file  Food Insecurity: Not on file  Transportation Needs: Not on file  Physical Activity: Not on file  Stress: Not on file  Social Connections: Not on file  Intimate Partner Violence: Not on file    Outpatient Medications Prior to Visit  Medication Sig Dispense Refill  . Cetirizine HCl (ZYRTEC PO) Take by mouth as needed.    . diphenhydrAMINE (BENADRYL) 25 MG tablet Take 25 mg by mouth every 6 (six) hours as needed.    . flunisolide (NASALIDE) 25 MCG/ACT (0.025%) SOLN Place 2 sprays into the nose 2 (two) times daily. 1 Bottle 12  . Multiple Vitamins-Minerals (  WOMENS MULTIVITAMIN PO) Take by mouth.    . Omega-3 Fatty Acids (OMEGA 3 PO) Take 1 tablet by mouth.    . bisoprolol-hydrochlorothiazide (ZIAC) 5-6.25 MG tablet TAKE 2 TABLETS EVERY MORNING AND 1 TABLET EVERY EVENING 90 tablet 0  . cyclobenzaprine (FLEXERIL) 10 MG tablet Take 1 tablet (10 mg total) by mouth 3 (three) times daily as needed for muscle spasms. 90 tablet 1  . meloxicam (MOBIC) 7.5 MG tablet TAKE 1 TABLET EVERY MORNING WITH FOOD AS NEEDED 90 tablet 1  . atorvastatin (LIPITOR) 20 MG tablet Take 1 tablet (20 mg total) by mouth daily. (Patient not taking: Reported on 02/01/2020) 30 tablet 2  . Loratadine (CLARITIN PO) Take by mouth as needed. (Patient not taking: Reported on 02/01/2020)     No facility-administered medications prior to visit.    Allergies  Allergen Reactions  . Penicillins   . Sulfonamide  Derivatives     ROS Review of Systems  Constitutional: Negative for appetite change, chills, fatigue and fever.  HENT: Negative for congestion, dental problem, ear pain and sore throat.   Eyes: Negative for discharge, redness and visual disturbance.  Respiratory: Negative for cough, chest tightness, shortness of breath and wheezing.   Cardiovascular: Negative for chest pain, palpitations and leg swelling.  Gastrointestinal: Negative for abdominal pain, blood in stool, diarrhea, nausea and vomiting.  Genitourinary: Negative for difficulty urinating, dysuria, flank pain, frequency, hematuria and urgency.  Musculoskeletal: Negative for arthralgias, back pain, joint swelling, myalgias and neck stiffness.  Skin: Negative for pallor and rash.  Neurological: Negative for dizziness, speech difficulty, weakness and headaches.  Hematological: Negative for adenopathy. Does not bruise/bleed easily.  Psychiatric/Behavioral: Negative for confusion and sleep disturbance. The patient is not nervous/anxious.     PE; Vitals with BMI 02/01/2020 02/27/2019 08/26/2018  Height 5\' 2"  - 5\' 2"   Weight 133 lbs 3 oz - 132 lbs 8 oz  BMI 24.36 - 24.23  Systolic 111 125  Diastolic 70 84 76  Pulse 68 - 64     Gen: Alert, well appearing.  Patient is oriented to person, place, time, and situation. AFFECT: pleasant, lucid thought and speech. ENT: Ears: EACs clear, normal epithelium.  TMs with good light reflex and landmarks bilaterally.  Eyes: no injection, icteris, swelling, or exudate.  EOMI, PERRLA. Nose: no drainage or turbinate edema/swelling.  No injection or focal lesion.  Mouth: lips without lesion/swelling.  Oral mucosa pink and moist.  Dentition intact and without obvious caries or gingival swelling.  Oropharynx without erythema, exudate, or swelling.  Neck: supple/nontender.  No LAD, mass, or TM.  Carotid pulses 2+ bilaterally, without bruits. CV: RRR, no m/r/g.   LUNGS: CTA bilat, nonlabored resps,  good aeration in all lung fields. ABD: soft, NT, ND, BS normal.  No hepatospenomegaly or mass.  No bruits. EXT: no clubbing, cyanosis, or edema.  Musculoskeletal: no joint swelling, erythema, warmth, or tenderness.  ROM of all joints intact. Skin - no sores or suspicious lesions or rashes or color changes   Pertinent labs:  Lab Results  Component Value Date   TSH 0.85 05/08/2016   Lab Results  Component Value Date   WBC 6.0 08/26/2018   HGB 14.7 08/26/2018   HCT 42.6 08/26/2018   MCV 81.8 08/26/2018   PLT 225 08/26/2018   Lab Results  Component Value Date   CREATININE 0.71 08/26/2018   BUN 18 08/26/2018   NA 138 08/26/2018   K 3.9 08/26/2018   CL 99 08/26/2018  CO2 30 08/26/2018   Lab Results  Component Value Date   ALT 19 08/26/2018   AST 22 08/26/2018   ALKPHOS 85 05/08/2016   BILITOT 1.2 08/26/2018   Lab Results  Component Value Date   CHOL 178 03/09/2019   Lab Results  Component Value Date   HDL 41.30 03/09/2019   Lab Results  Component Value Date   LDLCALC 106 (H) 08/26/2018   Lab Results  Component Value Date   TRIG 254.0 (H) 03/09/2019   Lab Results  Component Value Date   CHOLHDL 4 03/09/2019   Lab Results  Component Value Date   HGBA1C 5.4 08/26/2018   ASSESSMENT AND PLAN:   1) HTN: stable. Cont ziac 5-6.25, 2 qam and 1 qPM. Lytes/cr today.  2) HLD: intol of atorva. Will do trial of pravastatin 20mg  qd. FLP and hepatic panel today. Lab visit to recheck lipids and AST/ALT in 2 mo.  3) Recurrent neck and shoulder pain/arthralgias: responds well to prn meloxicam 7.5mg  and flexeril.  Both meds refilled today.  4) Health maintenance exam: Reviewed age and gender appropriate health maintenance issues (prudent diet, regular exercise, health risks of tobacco and excessive alcohol, use of seatbelts, fire alarms in home, use of sunscreen).  Also reviewed age and gender appropriate health screening as well as vaccine recommendations. Vaccines:  Shingrix->"it's on my list".  Tdap->pt declines.  Covid->pt declines.  Otherwise ALL UTD. Labs:  Fasting cbc, cmet, flp, and a1c (IFG). Cervical ca screening: no longer a candidate for cerv ca screening. Breast ca screening: ([facility]) next mammogram due 06/2020. Colon ca screening: cologuard neg 2019, plan repeat at [facility] in 1 yr. Osteoporosis screening: DEXA 06/2019 T score -2.0.  Plan repeat after 06/2021.  An After Visit Summary was printed and given to the patient.  FOLLOW UP:  Return for 2 mo lab visit for fasting cholesterol recheck.  Next in person o/v in 6 mo [facility]..  Signed:  07/2021, MD           02/01/2020

## 2020-02-01 NOTE — Patient Instructions (Signed)
Health Maintenance, Female Adopting a healthy lifestyle and getting preventive care are important in promoting health and wellness. Ask your health care provider about:  The right schedule for you to have regular tests and exams.  Things you can do on your own to prevent diseases and keep yourself healthy. What should I know about diet, weight, and exercise? Eat a healthy diet   Eat a diet that includes plenty of vegetables, fruits, low-fat dairy products, and lean protein.  Do not eat a lot of foods that are high in solid fats, added sugars, or sodium. Maintain a healthy weight Body mass index (BMI) is used to identify weight problems. It estimates body fat based on height and weight. Your health care provider can help determine your BMI and help you achieve or maintain a healthy weight. Get regular exercise Get regular exercise. This is one of the most important things you can do for your health. Most adults should:  Exercise for at least 150 minutes each week. The exercise should increase your heart rate and make you sweat (moderate-intensity exercise).  Do strengthening exercises at least twice a week. This is in addition to the moderate-intensity exercise.  Spend less time sitting. Even light physical activity can be beneficial. Watch cholesterol and blood lipids Have your blood tested for lipids and cholesterol at 74 years of age, then have this test every 5 years. Have your cholesterol levels checked more often if:  Your lipid or cholesterol levels are high.  You are older than 73 years of age.  You are at high risk for heart disease. What should I know about cancer screening? Depending on your health history and family history, you may need to have cancer screening at various ages. This may include screening for:  Breast cancer.  Cervical cancer.  Colorectal cancer.  Skin cancer.  Lung cancer. What should I know about heart disease, diabetes, and high blood  pressure? Blood pressure and heart disease  High blood pressure causes heart disease and increases the risk of stroke. This is more likely to develop in people who have high blood pressure readings, are of African descent, or are overweight.  Have your blood pressure checked: ? Every 3-5 years if you are 18-39 years of age. ? Every year if you are 40 years old or older. Diabetes Have regular diabetes screenings. This checks your fasting blood sugar level. Have the screening done:  Once every three years after age 40 if you are at a normal weight and have a low risk for diabetes.  More often and at a younger age if you are overweight or have a high risk for diabetes. What should I know about preventing infection? Hepatitis B If you have a higher risk for hepatitis B, you should be screened for this virus. Talk with your health care provider to find out if you are at risk for hepatitis B infection. Hepatitis C Testing is recommended for:  Everyone born from 1945 through 1965.  Anyone with known risk factors for hepatitis C. Sexually transmitted infections (STIs)  Get screened for STIs, including gonorrhea and chlamydia, if: ? You are sexually active and are younger than 73 years of age. ? You are older than 73 years of age and your health care provider tells you that you are at risk for this type of infection. ? Your sexual activity has changed since you were last screened, and you are at increased risk for chlamydia or gonorrhea. Ask your health care provider if   you are at risk.  Ask your health care provider about whether you are at high risk for HIV. Your health care provider may recommend a prescription medicine to help prevent HIV infection. If you choose to take medicine to prevent HIV, you should first get tested for HIV. You should then be tested every 3 months for as long as you are taking the medicine. Pregnancy  If you are about to stop having your period (premenopausal) and  you may become pregnant, seek counseling before you get pregnant.  Take 400 to 800 micrograms (mcg) of folic acid every day if you become pregnant.  Ask for birth control (contraception) if you want to prevent pregnancy. Osteoporosis and menopause Osteoporosis is a disease in which the bones lose minerals and strength with aging. This can result in bone fractures. If you are 65 years old or older, or if you are at risk for osteoporosis and fractures, ask your health care provider if you should:  Be screened for bone loss.  Take a calcium or vitamin D supplement to lower your risk of fractures.  Be given hormone replacement therapy (HRT) to treat symptoms of menopause. Follow these instructions at home: Lifestyle  Do not use any products that contain nicotine or tobacco, such as cigarettes, e-cigarettes, and chewing tobacco. If you need help quitting, ask your health care provider.  Do not use street drugs.  Do not share needles.  Ask your health care provider for help if you need support or information about quitting drugs. Alcohol use  Do not drink alcohol if: ? Your health care provider tells you not to drink. ? You are pregnant, may be pregnant, or are planning to become pregnant.  If you drink alcohol: ? Limit how much you use to 0-1 drink a day. ? Limit intake if you are breastfeeding.  Be aware of how much alcohol is in your drink. In the [place] one drink equals one 12 oz bottle of beer (355 mL), one 5 oz glass of wine (148 mL), or one 1 oz glass of hard liquor (44 mL). General instructions  Schedule regular health, dental, and eye exams.  Stay current with your vaccines.  Tell your health care provider if: ? You often feel depressed. ? You have ever been abused or do not feel safe at home. Summary  Adopting a healthy lifestyle and getting preventive care are important in promoting health and wellness.  Follow your health care provider's instructions about healthy  diet, exercising, and getting tested or screened for diseases.  Follow your health care provider's instructions on monitoring your cholesterol and blood pressure. This information is not intended to replace advice given to you by your health care provider. Make sure you discuss any questions you have with your health care provider. Document Revised: 01/12/2018 Document Reviewed: 01/12/2018 [employer] Patient Education  2020 [employer]  

## 2020-02-04 ENCOUNTER — Encounter: Payer: Self-pay | Admitting: Family Medicine

## 2020-02-13 NOTE — Progress Notes (Signed)
Subjective:   Sandy Gill is a 75 y.o. female who presents for Medicare Annual (Subsequent) preventive examination.  I connected with Sandy Gill today by telephone and verified that I am speaking with the correct person using two identifiers. Location patient: home Location provider: work Persons participating in the virtual visit: patient, Engineer, civil (consulting).    I discussed the limitations, risks, security and privacy concerns of performing an evaluation and management service by telephone and the availability of in person appointments. I also discussed with the patient that there may be a patient responsible charge related to this service. The patient expressed understanding and verbally consented to this telephonic visit.    Interactive audio and video telecommunications were attempted between this provider and patient, however failed, due to patient having technical difficulties OR patient did not have access to video capability.  We continued and completed visit with audio only.  Some vital signs may be absent or patient reported.   Time Spent with patient on telephone encounter: 20 minutes   Review of Systems     Cardiac Risk Factors include: advanced age (>109men, >57 women);hypertension;dyslipidemia     Objective:    Today's Vitals   02/14/20 0900  Weight: 133 lb (60.3 kg)  Height: 5\' 2"  (1.575 m)   Body mass index is 24.33 kg/m.  Advanced Directives 02/14/2020 05/08/2016  Does Patient Have a Medical Advance Directive? Yes No  Type of 07/08/2016 of [place];Living will -  Copy of Healthcare Power of Attorney in Chart? No - copy requested -  Would patient like information on creating a medical advance directive? - Yes (MAU/Ambulatory/Procedural Areas - Information given)    Current Medications (verified) Outpatient Encounter Medications as of 02/14/2020  Medication Sig  . bisoprolol-hydrochlorothiazide (ZIAC) 5-6.25 MG tablet TAKE 2 TABLETS EVERY MORNING  AND 1 TABLET EVERY EVENING  . Cetirizine HCl (ZYRTEC PO) Take by mouth as needed.  . cyclobenzaprine (FLEXERIL) 10 MG tablet Take 1 tablet (10 mg total) by mouth 3 (three) times daily as needed for muscle spasms.  . diphenhydrAMINE (BENADRYL) 25 MG tablet Take 25 mg by mouth every 6 (six) hours as needed.  . flunisolide (NASALIDE) 25 MCG/ACT (0.025%) SOLN Place 2 sprays into the nose 2 (two) times daily.  . meloxicam (MOBIC) 7.5 MG tablet 1 tab po qd prn pain  . Multiple Vitamins-Minerals (WOMENS MULTIVITAMIN PO) Take by mouth.  . Omega-3 Fatty Acids (OMEGA 3 PO) Take 1 tablet by mouth.  . pravastatin (PRAVACHOL) 20 MG tablet Take 1 tablet (20 mg total) by mouth daily.   No facility-administered encounter medications on file as of 02/14/2020.    Allergies (verified) Penicillins and Sulfonamide derivatives   History: Past Medical History:  Diagnosis Date  . Allergic rhinitis    Fall>spring  . Headache(784.0)   . Hyperlipemia, mixed 08/2018   Statin recommended 08/2018.  09/2018 Hypertension   . IFG (impaired fasting glucose)    HbA1c 5.3%-5.4% 2016-2018; 5.6% 2021.  2022 Kidney stones    x 1 episode  . Left rib fracture 2017  . Migraines    when "younger"  . Osteoarthritis    Hands, feet, knees: takes meloxicam occas  . Osteopenia 06/2019   T score -2.0.  Plan rpt 06/2021.  . Right shoulder injury 12/2014   R AC joint arthritis--steroid injection by ortho/sportsmed MD helped.  Pt also has full thickness RC tear on right.   Past Surgical History:  Procedure Laterality Date  . COLONOSCOPY  08/02/06  .  DEXA  06/2019   T score -2.0. Rpt 2 yrs   Family History  Problem Relation Age of Onset  . Arthritis Mother   . Hypertension Mother   . Arthritis Father   . Heart disease Father   . Hypertension Father   . AAA (abdominal aortic aneurysm) Father        age of death [id number]  . Arthritis Sister   . Hypertension Sister   . Hypertension Other   . Coronary artery disease Other    Social  History   Socioeconomic History  . Marital status: Widowed    Spouse name: Not on file  . Number of children: Not on file  . Years of education: Not on file  . Highest education level: Not on file  Occupational History  . Occupation: retired  Tobacco Use  . Smoking status: Never Smoker  . Smokeless tobacco: Never Used  Substance and Sexual Activity  . Alcohol use: No  . Drug use: No  . Sexual activity: Not on file  Other Topics Concern  . Not on file  Social History Narrative   Widow (husband Butch d 2020), 1 daughter, 2 grandchildren (grand-daughter lives with her).      Occupation: Advertising account plannerinsurance agent   No T/A/Ds.   Social Determinants of Health   Financial Resource Strain: Low Risk   . Difficulty of Paying Living Expenses: Not hard at all  Food Insecurity: No Food Insecurity  . Worried About Programme researcher, broadcasting/film/videounning Out of Food in the Last Year: Never true  . Ran Out of Food in the Last Year: Never true  Transportation Needs: No Transportation Needs  . Lack of Transportation (Medical): No  . Lack of Transportation (Non-Medical): No  Physical Activity: Sufficiently Active  . Days of Exercise per Week: 7 days  . Minutes of Exercise per Session: 30 min  Stress: No Stress Concern Present  . Feeling of Stress : Not at all  Social Connections: Moderately Isolated  . Frequency of Communication with Friends and Family: More than three times a week  . Frequency of Social Gatherings with Friends and Family: More than three times a week  . Attends Religious Services: More than 4 times per year  . Active Member of Clubs or Organizations: No  . Attends BankerClub or Organization Meetings: Never  . Marital Status: Widowed    Tobacco Counseling Counseling given: Not Answered   Clinical Intake:  Pre-visit preparation completed: Yes  Pain : No/denies pain     Nutritional Status: BMI of 19-24  Normal Nutritional Risks: None Diabetes: No  How often do you need to have someone help you when you  read instructions, pamphlets, or other written materials from your doctor or pharmacy?: 1 - Never  Diabetic?No  Interpreter Needed?: No  Information entered by :: Thomasenia SalesMartha Renel Ende LPN   Activities of Daily Living In your present state of health, do you have any difficulty performing the following activities: 02/14/2020  Hearing? Y  Comment hearing loss  Vision? N  Difficulty concentrating or making decisions? N  Walking or climbing stairs? N  Dressing or bathing? N  Doing errands, shopping? N  Preparing Food and eating ? N  Using the Toilet? N  In the past six months, have you accidently leaked urine? N  Do you have problems with loss of bowel control? N  Managing your Medications? N  Managing your Finances? N  Some recent data might be hidden    Patient Care Team: Jeoffrey MassedMcGowen, Philip H, MD  as PCP - General (Family Medicine) Monica Becton, MD as Consulting Physician (Sports Medicine)  Indicate any recent Medical Services you may have received from other than [facility] providers in the past year (date may be approximate).     Assessment:   This is a routine wellness examination for Binnie.  Hearing/Vision screen  Hearing Screening   125Hz  250Hz  500Hz  1000Hz  2000Hz  3000Hz  4000Hz  6000Hz  8000Hz   Right ear:           Left ear:           Comments: Some hearing loss  Vision Screening Comments: Wears reading glasses Last eye exam-2021  Dietary issues and exercise activities discussed: Current Exercise Habits: Home exercise routine, Type of exercise: walking, Time (Minutes): 25, Frequency (Times/Week): 7, Weekly Exercise (Minutes/Week): 175, Exercise limited by: None identified  Goals      Patient Stated   .  Patient (pt-stated)      "Continue to improve my diet"      Depression Screen PHQ 2/9 Scores 02/14/2020 02/01/2020 08/26/2018 08/13/2017 05/08/2016 06/20/2014 08/31/2012  PHQ - 2 Score 0 0 0 0 0 0 2  PHQ- 9 Score - - - - - - 13    Fall Risk Fall Risk  02/14/2020  02/01/2020 08/26/2018 08/13/2017 05/08/2016  Falls in the past year? 0 0 0 No No  Number falls in past yr: 0 0 0 - -  Injury with Fall? 0 0 0 - -  Follow up Falls prevention discussed Falls evaluation completed - - -    FALL RISK PREVENTION PERTAINING TO THE HOME:  Any stairs in or around the home? No  Home free of loose throw rugs in walkways, pet beds, electrical cords, etc? Yes  Adequate lighting in your home to reduce risk of falls? Yes   ASSISTIVE DEVICES UTILIZED TO PREVENT FALLS:  Life alert? No  Use of a cane, walker or w/c? No  Grab bars in the bathroom? Yes  Shower chair or bench in shower? No  Elevated toilet seat or a handicapped toilet? No   TIMED UP AND GO:  Was the test performed? No . Phone visit   Cognitive Function:Normal cognitive status assessed by  this Nurse Health Advisor. No abnormalities found.          Immunizations Immunization History  Administered Date(s) Administered  . Fluad Quad(high Dose 65+) 11/23/2019  . Influenza Split 01/08/2012  . Influenza Whole 02/02/2005, 11/25/2007, 10/30/2008, 12/10/2009  . Influenza, High Dose Seasonal PF 12/21/2012, 12/18/2013, 11/16/2014, 11/06/2016, 11/28/2017  . Influenza, Quadrivalent, Recombinant, Inj, Pf 11/13/2018, 11/14/2018  . Influenza,inj,Quad PF,6+ Mos 12/13/2015  . Influenza-Unspecified 11/06/2017  . Pneumococcal Conjugate-13 07/12/2014  . Pneumococcal Polysaccharide-23 05/08/2016  . Td 08/19/2009  . Zoster 07/27/2011    TDAP status: Due, Education has been provided regarding the importance of this vaccine. Advised may receive this vaccine at local pharmacy or Health Dept. Aware to provide a copy of the vaccination record if obtained from local pharmacy or Health Dept. Verbalized acceptance and understanding.  Flu Vaccine status: Up to date  Pneumococcal vaccine status: Up to date  Covid-19 vaccine status: Declined, Education has been provided regarding the importance of this vaccine but  patient still declined. Advised may receive this vaccine at local pharmacy or Health Dept.or vaccine clinic. Aware to provide a copy of the vaccination record if obtained from local pharmacy or Health Dept. Verbalized acceptance and understanding.  Qualifies for Shingles Vaccine? Yes   Zostavax completed Yes   Shingrix  Completed?: No.    Education has been provided regarding the importance of this vaccine. Patient has been advised to call insurance company to determine out of pocket expense if they have not yet received this vaccine. Advised may also receive vaccine at local pharmacy or Health Dept. Verbalized acceptance and understanding.  Screening Tests Health Maintenance  Topic Date Due  . COVID-19 Vaccine (1) 04/01/2020 (Originally 08/13/1957)  . TETANUS/TDAP  01/31/2021 (Originally 08/20/2019)  . Fecal DNA (Cologuard)  03/19/2020  . MAMMOGRAM  06/14/2020  . DEXA SCAN  06/07/2021  . INFLUENZA VACCINE  Completed  . Hepatitis C Screening  Completed  . PNA vac Low Risk Adult  Completed    Health Maintenance  There are no preventive care reminders to display for this patient.  Colorectal cancer screening: Type of screening: Cologuard. Completed 03/19/2017. Repeat every 3 years  Mammogram status: Completed Bilater 06/08/2019 & Unilater(R) 06/15/2019. Repeat every year  Bone Density status: Completed 06/08/2019. Results reflect: Bone density results: OSTEOPENIA. Repeat every 2 years.  Lung Cancer Screening: (Low Dose CT Chest recommended if Age 71-80 years, 30 pack-year currently smoking OR have quit w/in 15years.) does not qualify.    Additional Screening:  Hepatitis C Screening: Completed 05/08/2016  Vision Screening: Recommended annual ophthalmology exams for early detection of glaucoma and other disorders of the eye. Is the patient up to date with their annual eye exam?  Yes  Who is the provider or what is the name of the office in which the patient attends annual eye exams? Unsure of  name   Dental Screening: Recommended annual dental exams for proper oral hygiene  Community Resource Referral / Chronic Care Management: CRR required this visit?  No   CCM required this visit?  No      Plan:     I have personally reviewed and noted the following in the patient's chart:   . Medical and social history . Use of alcohol, tobacco or illicit drugs  . Current medications and supplements . Functional ability and status . Nutritional status . Physical activity . Advanced directives . List of other physicians . Hospitalizations, surgeries, and ER visits in previous 12 months . Vitals . Screenings to include cognitive, depression, and falls . Referrals and appointments  In addition, I have reviewed and discussed with patient certain preventive protocols, quality metrics, and best practice recommendations. A written personalized care plan for preventive services as well as general preventive health recommendations were provided to patient.   Due to this being a telephonic visit, the after visit summary with patients personalized plan was offered to patient via mail or my-chart.  Patient would like to access on my-chart.   Roanna Raider, LPN   [phone]  Nurse Health Advisor  Nurse Notes: None

## 2020-02-14 ENCOUNTER — Ambulatory Visit (INDEPENDENT_AMBULATORY_CARE_PROVIDER_SITE_OTHER): Payer: Medicare Other

## 2020-02-14 VITALS — Ht 62.0 in | Wt 133.0 lb

## 2020-02-14 DIAGNOSIS — Z0000 Encounter for general adult medical examination without abnormal findings: Secondary | ICD-10-CM | POA: Diagnosis not present

## 2020-02-14 NOTE — Patient Instructions (Signed)
Sandy Gill , Thank you for taking time to complete your Medicare Wellness Visit. I appreciate your ongoing commitment to your health goals. Please review the following plan we discussed and let me know if I can assist you in the future.   Screening recommendations/referrals: Colonoscopy: Completed Cologuard 03/19/2017-Due-03/19/2020 Mammogram: Completed 06/08/2019-Due-06/07/2020 Bone Density: Completed 06/08/2019-Due 06/07/2021 Recommended yearly ophthalmology/optometry visit for glaucoma screening and checkup Recommended yearly dental visit for hygiene and checkup  Vaccinations: Influenza vaccine: Up to date Pneumococcal vaccine: Completed vaccines Tdap vaccine: Discuss with pharmacy Shingles vaccine:  Discuss with pharmacy  Covid-19:Completed vaccines  Advanced directives: please bring a copy for your chart  Conditions/risks identified: See problem list  Next appointment: Follow up in one year for your annual wellness visit 02/19/21 @ 9:00   Preventive Care 74 Years and Older, Female Preventive care refers to lifestyle choices and visits with your health care provider that can promote health and wellness. What does preventive care include?  A yearly physical exam. This is also called an annual well check.  Dental exams once or twice a year.  Routine eye exams. Ask your health care provider how often you should have your eyes checked.  Personal lifestyle choices, including:  Daily care of your teeth and gums.  Regular physical activity.  Eating a healthy diet.  Avoiding tobacco and drug use.  Limiting alcohol use.  Practicing safe sex.  Taking low-dose aspirin every day.  Taking vitamin and mineral supplements as recommended by your health care provider. What happens during an annual well check? The services and screenings done by your health care provider during your annual well check will depend on your age, overall health, lifestyle risk factors, and family history of  disease. Counseling  Your health care provider may ask you questions about your:  Alcohol use.  Tobacco use.  Drug use.  Emotional well-being.  Home and relationship well-being.  Sexual activity.  Eating habits.  History of falls.  Memory and ability to understand (cognition).  Work and work Astronomer.  Reproductive health. Screening  You may have the following tests or measurements:  Height, weight, and BMI.  Blood pressure.  Lipid and cholesterol levels. These may be checked every 5 years, or more frequently if you are over 7 years old.  Skin check.  Lung cancer screening. You may have this screening every year starting at age 74 if you have a 30-pack-year history of smoking and currently smoke or have quit within the past 15 years.  Fecal occult blood test (FOBT) of the stool. You may have this test every year starting at age 45.  Flexible sigmoidoscopy or colonoscopy. You may have a sigmoidoscopy every 5 years or a colonoscopy every 10 years starting at age 25.  Hepatitis C blood test.  Hepatitis B blood test.  Sexually transmitted disease (STD) testing.  Diabetes screening. This is done by checking your blood sugar (glucose) after you have not eaten for a while (fasting). You may have this done every 1-3 years.  Bone density scan. This is done to screen for osteoporosis. You may have this done starting at age 57.  Mammogram. This may be done every 1-2 years. Talk to your health care provider about how often you should have regular mammograms. Talk with your health care provider about your test results, treatment options, and if necessary, the need for more tests. Vaccines  Your health care provider may recommend certain vaccines, such as:  Influenza vaccine. This is recommended every year.  Tetanus,  diphtheria, and acellular pertussis (Tdap, Td) vaccine. You may need a Td booster every 10 years.  Zoster vaccine. You may need this after age  74.  Pneumococcal 13-valent conjugate (PCV13) vaccine. One dose is recommended after age 74.  Pneumococcal polysaccharide (PPSV23) vaccine. One dose is recommended after age 10. Talk to your health care provider about which screenings and vaccines you need and how often you need them. This information is not intended to replace advice given to you by your health care provider. Make sure you discuss any questions you have with your health care provider. Document Released: 02/15/2015 Document Revised: 10/09/2015 Document Reviewed: 11/20/2014 Elsevier Interactive Patient Education  2017 [place] Prevention in the Home Falls can cause injuries. They can happen to people of all ages. There are many things you can do to make your home safe and to help prevent falls. What can I do on the outside of my home?  Regularly fix the edges of walkways and driveways and fix any cracks.  Remove anything that might make you trip as you walk through a door, such as a raised step or threshold.  Trim any bushes or trees on the path to your home.  Use bright outdoor lighting.  Clear any walking paths of anything that might make someone trip, such as rocks or tools.  Regularly check to see if handrails are loose or broken. Make sure that both sides of any steps have handrails.  Any raised decks and porches should have guardrails on the edges.  Have any leaves, snow, or ice cleared regularly.  Use sand or salt on walking paths during winter.  Clean up any spills in your garage right away. This includes oil or grease spills. What can I do in the bathroom?  Use night lights.  Install grab bars by the toilet and in the tub and shower. Do not use towel bars as grab bars.  Use non-skid mats or decals in the tub or shower.  If you need to sit down in the shower, use a plastic, non-slip stool.  Keep the floor dry. Clean up any water that spills on the floor as soon as it happens.  Remove  soap buildup in the tub or shower regularly.  Attach bath mats securely with double-sided non-slip rug tape.  Do not have throw rugs and other things on the floor that can make you trip. What can I do in the bedroom?  Use night lights.  Make sure that you have a light by your bed that is easy to reach.  Do not use any sheets or blankets that are too big for your bed. They should not hang down onto the floor.  Have a firm chair that has side arms. You can use this for support while you get dressed.  Do not have throw rugs and other things on the floor that can make you trip. What can I do in the kitchen?  Clean up any spills right away.  Avoid walking on wet floors.  Keep items that you use a lot in easy-to-reach places.  If you need to reach something above you, use a strong step stool that has a grab bar.  Keep electrical cords out of the way.  Do not use floor polish or wax that makes floors slippery. If you must use wax, use non-skid floor wax.  Do not have throw rugs and other things on the floor that can make you trip. What can I do with  my stairs?  Do not leave any items on the stairs.  Make sure that there are handrails on both sides of the stairs and use them. Fix handrails that are broken or loose. Make sure that handrails are as long as the stairways.  Check any carpeting to make sure that it is firmly attached to the stairs. Fix any carpet that is loose or worn.  Avoid having throw rugs at the top or bottom of the stairs. If you do have throw rugs, attach them to the floor with carpet tape.  Make sure that you have a light switch at the top of the stairs and the bottom of the stairs. If you do not have them, ask someone to add them for you. What else can I do to help prevent falls?  Wear shoes that:  Do not have high heels.  Have rubber bottoms.  Are comfortable and fit you well.  Are closed at the toe. Do not wear sandals.  If you use a  stepladder:  Make sure that it is fully opened. Do not climb a closed stepladder.  Make sure that both sides of the stepladder are locked into place.  Ask someone to hold it for you, if possible.  Clearly mark and make sure that you can see:  Any grab bars or handrails.  First and last steps.  Where the edge of each step is.  Use tools that help you move around (mobility aids) if they are needed. These include:  Canes.  Walkers.  Scooters.  Crutches.  Turn on the lights when you go into a dark area. Replace any light bulbs as soon as they burn out.  Set up your furniture so you have a clear path. Avoid moving your furniture around.  If any of your floors are uneven, fix them.  If there are any pets around you, be aware of where they are.  Review your medicines with your doctor. Some medicines can make you feel dizzy. This can increase your chance of falling. Ask your doctor what other things that you can do to help prevent falls. This information is not intended to replace advice given to you by your health care provider. Make sure you discuss any questions you have with your health care provider. Document Released: 11/15/2008 Document Revised: 06/27/2015 Document Reviewed: 02/23/2014 Elsevier Interactive Patient Education  2017 [employer].

## 2020-02-23 ENCOUNTER — Other Ambulatory Visit: Payer: Self-pay

## 2020-02-23 MED ORDER — MELOXICAM 7.5 MG PO TABS: ORAL_TABLET | ORAL | 1 refills | Status: DC

## 2020-02-23 MED ORDER — BISOPROLOL-HYDROCHLOROTHIAZIDE 5-6.25 MG PO TABS: ORAL_TABLET | ORAL | 1 refills | Status: DC

## 2020-02-23 MED ORDER — CYCLOBENZAPRINE HCL 10 MG PO TABS: 10.0000 mg | ORAL_TABLET | Freq: Three times a day (TID) | ORAL | 1 refills | Status: DC | PRN

## 2020-03-04 ENCOUNTER — Telehealth: Payer: Self-pay

## 2020-03-04 NOTE — Telephone Encounter (Signed)
PA sent via covermymed on 03/04/20   Key: [id number]   Medication: Bisoprolol-hctz   Dx: essential hypertension, I10   Per Dr. Milinda Cave pt has tried and failed atenolol-chlorthalidone 50-25mg , carvedilol 6.25mg    Waiting for response.

## 2020-03-05 NOTE — Telephone Encounter (Signed)
PA denied. LM for pt to return call to see if last refill on 1/21 received.

## 2020-03-05 NOTE — Telephone Encounter (Addendum)
Spoke with patient and she has enough for another week. Originally told patient I would submit new PA but LM for return call to see if patient could call insurance and see if they will cover bisoprolol and hydrochlorothiazide as separate pills instead.

## 2020-03-06 NOTE — Telephone Encounter (Signed)
Spoke with patient and she is going to call insurance to verify coverage for split pills and let me know.

## 2020-03-07 ENCOUNTER — Other Ambulatory Visit: Payer: Self-pay

## 2020-03-07 MED ORDER — BISOPROLOL-HYDROCHLOROTHIAZIDE 5-6.25 MG PO TABS: ORAL_TABLET | ORAL | 3 refills | Status: DC

## 2020-03-07 NOTE — Telephone Encounter (Signed)
Insurance will no longer 3 pills daily but only 2 pills. Any other suggestions? They would not agree to split medications for bisoprolol and hydrochlorothiazide. Pt would like to use [facility] in [place] temporarily.

## 2020-03-07 NOTE — Telephone Encounter (Signed)
Pt chose to do 90 d/s for current Rx

## 2020-03-07 NOTE — Telephone Encounter (Signed)
If insurer won't cover separate bisoprolol and hctz prescriptions then I think the thing to do is take 1 of the current bisoc/hct tabs twice a day and see how bp's go.  If rising consistently above 140 on top or 90 on bottom then she should make return appt. If pt ok with this plan pls do eRx for bisoprolol-hctz 5-6.25 tabs, 1 bid, #60, RF x 11 OR can do 90d supply if pt prefers.-thx

## 2020-03-07 NOTE — Addendum Note (Signed)
Addended by: Paschal Dopp on: 03/07/2020 01:31 PM   Modules accepted: Orders

## 2020-03-27 ENCOUNTER — Ambulatory Visit: Payer: Medicare HMO

## 2020-04-22 ENCOUNTER — Ambulatory Visit: Payer: Medicare Other

## 2020-04-30 ENCOUNTER — Ambulatory Visit: Payer: Medicare Other

## 2020-06-20 ENCOUNTER — Other Ambulatory Visit: Payer: Self-pay | Admitting: Family Medicine

## 2020-07-30 ENCOUNTER — Encounter: Payer: Self-pay | Admitting: Family Medicine

## 2020-07-30 ENCOUNTER — Ambulatory Visit (INDEPENDENT_AMBULATORY_CARE_PROVIDER_SITE_OTHER): Payer: Medicare Other | Admitting: Family Medicine

## 2020-07-30 ENCOUNTER — Other Ambulatory Visit: Payer: Self-pay

## 2020-07-30 VITALS — BP 132/74 | HR 57 | Temp 97.6°F | Resp 16 | Ht 62.0 in | Wt 133.0 lb

## 2020-07-30 DIAGNOSIS — E7800 Pure hypercholesterolemia, unspecified: Secondary | ICD-10-CM | POA: Diagnosis not present

## 2020-07-30 DIAGNOSIS — Z789 Other specified health status: Secondary | ICD-10-CM | POA: Diagnosis not present

## 2020-07-30 DIAGNOSIS — R7301 Impaired fasting glucose: Secondary | ICD-10-CM

## 2020-07-30 DIAGNOSIS — I10 Essential (primary) hypertension: Secondary | ICD-10-CM | POA: Diagnosis not present

## 2020-07-30 LAB — BASIC METABOLIC PANEL
BUN: 15 mg/dL (ref 6–23)
CO2: 28 mEq/L (ref 19–32)
Calcium: 9.7 mg/dL (ref 8.4–10.5)
Chloride: 98 mEq/L (ref 96–112)
Creatinine, Ser: 0.72 mg/dL (ref 0.40–1.20)
GFR: 82.63 mL/min (ref >60.00)
Glucose, Bld: 95 mg/dL (ref 70–99)
Potassium: 3.9 mEq/L (ref 3.5–5.1)
Sodium: 137 mEq/L (ref 135–145)

## 2020-07-30 LAB — LDL CHOLESTEROL, DIRECT: Direct LDL: 100 mg/dL

## 2020-07-30 LAB — LIPID PANEL
Cholesterol: 176 mg/dL (ref 0–200)
HDL: 43.7 mg/dL (ref >39.00)
NonHDL: 132.39
Total CHOL/HDL Ratio: 4
Triglycerides: 203 mg/dL — ABNORMAL HIGH (ref 0.0–149.0)
VLDL: 40.6 mg/dL — ABNORMAL HIGH (ref 0.0–40.0)

## 2020-07-30 MED ORDER — BISOPROLOL-HYDROCHLOROTHIAZIDE 5-6.25 MG PO TABS: ORAL_TABLET | ORAL | 3 refills | Status: DC

## 2020-07-30 NOTE — Progress Notes (Signed)
OFFICE VISIT  07/30/2020  CC:  Chief Complaint  Patient presents with   Follow-up    RCI, pt is fasting   HPI:    Patient is a 75 y.o. Caucasian female who presents for 6 mo f/u HTN, HLD, IFG. A/P as of last visit: "1) HTN: stable. Cont ziac 5-6.25, 2 qam and 1 qPM. Lytes/cr today.   2) HLD: intol of atorva. Will do trial of pravastatin 20mg  qd. FLP and hepatic panel today. Lab visit to recheck lipids and AST/ALT in 2 mo.   3) Recurrent neck and shoulder pain/arthralgias: responds well to prn meloxicam 7.5mg  and flexeril.  Both meds refilled today.   4) Health maintenance exam: Reviewed age and gender appropriate health maintenance issues (prudent diet, regular exercise, health risks of tobacco and excessive alcohol, use of seatbelts, fire alarms in home, use of sunscreen).  Also reviewed age and gender appropriate health screening as well as vaccine recommendations. Vaccines: Shingrix->"it's on my list".  Tdap->pt declines.  Covid->pt declines.  Otherwise ALL UTD. Labs:  Fasting cbc, cmet, flp, and a1c (IFG). Cervical ca screening: no longer a candidate for cerv ca screening. Breast ca screening: ([facility]) next mammogram due 06/2020. Colon ca screening: cologuard neg 2019, plan repeat at CPE in 1 yr. Osteoporosis screening: DEXA 06/2019 T score -2.0.  Plan repeat after 06/2021."  INTERIM HX: Feeling well, spends a lot of time with granddaughter 07/2021. Not walking as much d/t heat.  HTN: taking bisop/hct 5-6.25, 2 qAM and 1 qPM.  HLD: intol of atorva, tried prava 01/2020-->didn't tolerate b/c elbows, hands, shoulders ached--->stopped med and it all resolved. Declines further statin trial, wants to focus on healthier diet.  IFG:  says she has cut back on her simple carb intake.  ROS as above, plus--> no fevers, no CP, no SOB, no wheezing, no cough, no dizziness, no HAs, no rashes, no melena/hematochezia.  No polyuria or polydipsia.  No myalgias or arthralgias.  No  focal weakness, paresthesias, or tremors.  No acute vision or hearing abnormalities.  No dysuria or unusual/new urinary urgency or frequency.  No recent changes in lower legs. No n/v/d or abd pain.  No palpitations.     Past Medical History:  Diagnosis Date   Allergic rhinitis    Fall>spring   Headache(784.0)    Hyperlipemia, mixed 08/2018   Statin recommended 08/2018.   Hypertension    IFG (impaired fasting glucose)    HbA1c 5.3%-5.4% 2016-2018; 5.6% 2021.   Kidney stones    x 1 episode   Left rib fracture 2017   Migraines    when "younger"   Osteoarthritis    Hands, feet, knees: takes meloxicam occas   Osteopenia 06/2019   T score -2.0.  Plan rpt 06/2021.   Right shoulder injury 12/2014   R AC joint arthritis--steroid injection by ortho/sportsmed MD helped.  Pt also has full thickness RC tear on right.    Past Surgical History:  Procedure Laterality Date   COLONOSCOPY  08/02/06   DEXA  06/2019   T score -2.0. Rpt 2 yrs    Outpatient Medications Prior to Visit  Medication Sig Dispense Refill   diphenhydrAMINE (BENADRYL) 25 MG tablet Take 25 mg by mouth every 6 (six) hours as needed.     meloxicam (MOBIC) 7.5 MG tablet TAKE 1 TABLET BY MOUTH  DAILY AS NEEDED FOR PAIN 30 tablet 0   Multiple Vitamins-Minerals (WOMENS MULTIVITAMIN PO) Take by mouth.     Omega-3 Fatty Acids (OMEGA  3 PO) Take 1 tablet by mouth.     bisoprolol-hydrochlorothiazide (ZIAC) 5-6.25 MG tablet TAKE 2 TABLET EVERY MORNING AND 1 TABLET EVERY EVENING 270 tablet 3   Cetirizine HCl (ZYRTEC PO) Take by mouth as needed. (Patient not taking: Reported on 07/30/2020)     cyclobenzaprine (FLEXERIL) 10 MG tablet Take 1 tablet (10 mg total) by mouth 3 (three) times daily as needed for muscle spasms. (Patient not taking: Reported on 07/30/2020) 90 tablet 1   flunisolide (NASALIDE) 25 MCG/ACT (0.025%) SOLN Place 2 sprays into the nose 2 (two) times daily. (Patient not taking: Reported on 07/30/2020) 1 Bottle 12    pravastatin (PRAVACHOL) 20 MG tablet Take 1 tablet (20 mg total) by mouth daily. (Patient not taking: Reported on 07/30/2020) 30 tablet 2   No facility-administered medications prior to visit.    Allergies  Allergen Reactions   Penicillins    Sulfonamide Derivatives     ROS As per HPI  PE: Vitals with BMI 07/30/2020 02/14/2020 02/01/2020  Height 5\' 2"  5\' 2"  5\' 2"   Weight 133 lbs 133 lbs 133 lbs 3 oz  BMI 24.32 24.32 24.36  Systolic 132 - 111  Diastolic 74 - 70  Pulse 57 - 68   Gen: Alert, well appearing.  Patient is oriented to person, place, time, and situation. AFFECT: pleasant, lucid thought and speech. CV: RRR, no m/r/g.   LUNGS: CTA bilat, nonlabored resps, good aeration in all lung fields. EXT: no clubbing or cyanosis.  no edema.     LABS:  Lab Results  Component Value Date   TSH 0.85 05/08/2016   Lab Results  Component Value Date   WBC 8.7 02/01/2020   HGB 14.0 02/01/2020   HCT 41.0 02/01/2020   MCV 81.6 02/01/2020   PLT 186.0 02/01/2020   Lab Results  Component Value Date   CREATININE 0.68 02/01/2020   BUN 18 02/01/2020   NA 137 02/01/2020   K 3.5 02/01/2020   CL 100 02/01/2020   CO2 30 02/01/2020   Lab Results  Component Value Date   ALT 21 02/01/2020   AST 18 02/01/2020   ALKPHOS 72 02/01/2020   BILITOT 1.8 (H) 02/01/2020   Lab Results  Component Value Date   CHOL 174 02/01/2020   Lab Results  Component Value Date   HDL 46.90 02/01/2020   Lab Results  Component Value Date   LDLCALC 98 02/01/2020   Lab Results  Component Value Date   TRIG 143.0 02/01/2020   Lab Results  Component Value Date   CHOLHDL 4 02/01/2020   Lab Results  Component Value Date   HGBA1C 5.6 02/01/2020   IMPRESSION AND PLAN:  HTN: good control on bisop-hct 5-6.25, 2 qAM and 1 qPM. Lytes/cr today.  HLD: intol of both atorva and prava-->pt declines any further cholesterol medication. Working on improved diet and activity level.  IFG: Hba1c's have been  5.3-5.6% since 2016, most recent was 6 mo ago. OK to start monitoring this lab just once a year at this point.  GLucose level check today. Cont improvements in carb intake.  An After Visit Summary was printed and given to the patient.  FOLLOW UP: Return in about 6 months (around 01/29/2021) for annual CPE (fasting) +RCI.  Signed:  02-05-2001, MD           07/30/2020

## 2020-08-03 ENCOUNTER — Other Ambulatory Visit: Payer: Self-pay | Admitting: Family Medicine

## 2020-08-25 DIAGNOSIS — U071 COVID-19: Secondary | ICD-10-CM | POA: Diagnosis not present

## 2020-08-26 ENCOUNTER — Telehealth: Payer: Self-pay

## 2020-08-26 DIAGNOSIS — U071 COVID-19: Secondary | ICD-10-CM

## 2020-08-26 HISTORY — DX: COVID-19: U07.1

## 2020-08-26 MED ORDER — NIRMATRELVIR/RITONAVIR (PAXLOVID)TABLET: 3.0000 | ORAL_TABLET | Freq: Two times a day (BID) | ORAL | 0 refills | Status: AC

## 2020-08-26 NOTE — Addendum Note (Signed)
Addended by: Jeoffrey Massed on: 08/26/2020 12:52 PM   Modules accepted: Orders

## 2020-08-26 NOTE — Telephone Encounter (Signed)
[facility] Day - Client TELEPHONE ADVICE RECORD [facility] Patient Name: Sandy Gill IEPPI Gender: Female DOB: Apr 18, 1945 Age: 75 Y 13 D Return Phone Number: [phone] (Primary) Address: City/ State/ Zip: [place] [address] Client [facility] [place] Day - Client Client Site [facility] - Day Physician Santiago Bumpers - MD Contact Type Call Who Is Calling Patient / Member / Family / Caregiver Call Type Triage / Clinical Relationship To Patient Self Return Phone Number [phone] (Primary) Chief Complaint Nasal Congestion Reason for Call Symptomatic / Request for Health Information Initial Comment Caller states she tested positive for COVID. She has a sore throat and congestion. She is requesting a prescription. Translation No Nurse Assessment Nurse: Felipa Furnace, RN, Tannia Date/Time (Eastern Time): 08/25/2020 6:14:22 PM Confirm and document reason for call. If symptomatic, describe symptoms. ---Caller states she tested positive for COVID. She has a sore throat and congestion. She is requesting a prescription. Symptoms started yesterday. not vaccinated. Denies fever Does the patient have any new or worsening symptoms? ---Yes Will a triage be completed? ---Yes Related visit to physician within the last 2 weeks? ---No Does the PT have any chronic conditions? (i.e. diabetes, asthma, this includes High risk factors for pregnancy, etc.) ---Yes List chronic conditions. ---HTN Is this a behavioral health or substance abuse call? ---No Guidelines Guideline Title Affirmed Question Affirmed Notes Nurse Date/Time (Eastern Time) COVID-19 - Diagnosed or Suspected [1] HIGH RISK for severe COVID complications (e.g., weak immune system, age > 64 years, obesity with BMI > 25, pregnant, chronic lung disease or other Charyl Bigger 08/25/2020 6:16:33 PM PLEASE NOTE: All timestamps contained within this report are  represented as [place] Standard Time. CONFIDENTIALTY NOTICE: This fax transmission is intended only for the addressee. It contains information that is legally privileged, confidential or otherwise protected from use or disclosure. If you are not the intended recipient, you are strictly prohibited from reviewing, disclosing, copying using or disseminating any of this information or taking any action in reliance on or regarding this information. If you have received this fax in error, please notify us immediately by telephone so that we can arrange for its return to [place]. Phone: [phone], Toll-Free: [phone], Fax: [phone] Page: 2 of 2 Call Id: [id number] Guidelines Guideline Title Affirmed Question Affirmed Notes Nurse Date/Time Lamount Cohen Time) chronic medical condition) AND [2] COVID symptoms (e.g., cough, fever) (Exceptions: Already seen by PCP and no new or worsening symptoms.) Disp. Time Lamount Cohen Time) Disposition Final User 08/25/2020 6:30:28 PM Called On-Call Provider Felipa Furnace, RN, Steffanie Dunn 08/25/2020 6:25:37 PM Call PCP Now Yes Felipa Furnace, RN, Steffanie Dunn Caller Disagree/Comply Comply Caller Understands Yes PreDisposition Call Doctor Care Advice Given Per Guideline CALL PCP NOW: * I'll page the on-call provider now. If you haven't heard from the provider (or me) within 30 minutes, call again. CALL BACK IF: * You become worse CARE ADVICE given per COVID-19 - DIAGNOSED OR SUSPECTED (Adult) guideline. Comments User: Sherlynn Stalls, RN Date/Time Lamount Cohen Time): 08/25/2020 6:32:18 PM caller updated, verbalizes understanding Paging DoctorName Phone DateTime Result/ Outcome Message Type Notes Abbe Amsterdam- MD [phone] 08/25/2020 6:30:28 PM Called On Call Provider - Reached Doctor Paged Abbe Amsterdam- MD 08/25/2020 6:31:55 PM Spoke with On Call - Outcome Notification Message Result OCP updated, states patient needs to be seen for anitviral medication, meantime she  can take OTC medication and for worsening symptoms such as SOB she would need to go to ED or urgent care

## 2020-08-26 NOTE — Telephone Encounter (Signed)
Pt advised rx sent for antiviral med.

## 2020-08-26 NOTE — Telephone Encounter (Signed)
Paxlovid eRxd 

## 2020-08-26 NOTE — Telephone Encounter (Signed)
Caller states she tested positive for COVID. She has a sore throat and congestion. She is requesting a prescription. Symptoms started yesterday. Not vaccinated. Denies fever. LOV 07/30/20  Please Advise.

## 2021-01-04 ENCOUNTER — Other Ambulatory Visit: Payer: Self-pay | Admitting: Family Medicine

## 2021-02-04 ENCOUNTER — Encounter: Payer: Medicare Other | Admitting: Family Medicine

## 2021-02-04 NOTE — Progress Notes (Deleted)
Office Note 02/04/2021  CC: No chief complaint on file.   HPI:  Patient is a 76 y.o. female who is here for annual health maintenance exam and 6 mo f/u HTN, HLD, and IFG. A/P as of last visit: "HTN: good control on bisop-hct 5-6.25, 2 qAM and 1 qPM. Lytes/cr today.   HLD: intol of both atorva and prava-->pt declines any further cholesterol medication. Working on improved diet and activity level.   IFG: Hba1c's have been 5.3-5.6% since 2016, most recent was 6 mo ago. OK to start monitoring this lab just once a year at this point.  GLucose level check today. Cont improvements in carb intake."  INTERIM HX: ***   Past Medical History:  Diagnosis Date   Allergic rhinitis    Fall>spring   COVID-19 virus infection 08/26/2020   Headache(784.0)    Hyperlipemia, mixed 08/2018   Statin recommended 08/2018.   Hypertension    IFG (impaired fasting glucose)    HbA1c 5.3%-5.4% 2016-2018; 5.6% 2021.   Kidney stones    x 1 episode   Left rib fracture 2017   Migraines    when "younger"   Osteoarthritis    Hands, feet, knees: takes meloxicam occas   Osteopenia 06/2019   T score -2.0.  Plan rpt 06/2021.   Right shoulder injury 12/2014   R AC joint arthritis--steroid injection by ortho/sportsmed MD helped.  Pt also has full thickness RC tear on right.    Past Surgical History:  Procedure Laterality Date   COLONOSCOPY  08/02/06   DEXA  06/2019   T score -2.0. Rpt 2 yrs    Family History  Problem Relation Age of Onset   Arthritis Mother    Hypertension Mother    Arthritis Father    Heart disease Father    Hypertension Father    AAA (abdominal aortic aneurysm) Father        age of death [id number]   Arthritis Sister    Hypertension Sister    Hypertension Other    Coronary artery disease Other     Social History   Socioeconomic History   Marital status: Widowed    Spouse name: Not on file   Number of children: Not on file   Years of education: Not on file   Highest  education level: Not on file  Occupational History   Occupation: retired  Tobacco Use   Smoking status: Never   Smokeless tobacco: Never  Substance and Sexual Activity   Alcohol use: No   Drug use: No   Sexual activity: Not on file  Other Topics Concern   Not on file  Social History Narrative   Widow (husband Butch d 2020), 1 daughter, 2 grandchildren (grand-daughter lives with her).      Occupation: Advertising account plannerinsurance agent   No T/A/Ds.   Social Determinants of Health   Financial Resource Strain: Low Risk    Difficulty of Paying Living Expenses: Not hard at all  Food Insecurity: No Food Insecurity   Worried About Programme researcher, broadcasting/film/videounning Out of Food in the Last Year: Never true   Ran Out of Food in the Last Year: Never true  Transportation Needs: No Transportation Needs   Lack of Transportation (Medical): No   Lack of Transportation (Non-Medical): No  Physical Activity: Sufficiently Active   Days of Exercise per Week: 7 days   Minutes of Exercise per Session: 30 min  Stress: No Stress Concern Present   Feeling of Stress : Not at all  Social Connections: Moderately  Isolated   Frequency of Communication with Friends and Family: More than three times a week   Frequency of Social Gatherings with Friends and Family: More than three times a week   Attends Religious Services: More than 4 times per year   Active Member of [employer] or Organizations: No   Attends Banker Meetings: Never   Marital Status: Widowed  Catering manager Violence: Not At Risk   Fear of Current or Ex-Partner: No   Emotionally Abused: No   Physically Abused: No   Sexually Abused: No    Outpatient Medications Prior to Visit  Medication Sig Dispense Refill   bisoprolol-hydrochlorothiazide (ZIAC) 5-6.25 MG tablet TAKE 2 TABLET EVERY MORNING AND 1 TABLET EVERY EVENING 270 tablet 3   diphenhydrAMINE (BENADRYL) 25 MG tablet Take 25 mg by mouth every 6 (six) hours as needed.     meloxicam (MOBIC) 7.5 MG tablet TAKE 1 TABLET  BY MOUTH  DAILY AS NEEDED FOR PAIN 90 tablet 1   Multiple Vitamins-Minerals (WOMENS MULTIVITAMIN PO) Take by mouth.     Omega-3 Fatty Acids (OMEGA 3 PO) Take 1 tablet by mouth.     No facility-administered medications prior to visit.    Allergies  Allergen Reactions   Penicillins    Sulfonamide Derivatives     ROS *** PE; Vitals with BMI 07/30/2020 02/14/2020 02/01/2020  Height 5\' 2"  5\' 2"  5\' 2"   Weight 133 lbs 133 lbs 133 lbs 3 oz  BMI 24.32 24.32 24.36  Systolic 132 - 111  Diastolic 74 - 70  Pulse 57 - 68     *** Pertinent labs:  Lab Results  Component Value Date   TSH 0.85 05/08/2016   Lab Results  Component Value Date   WBC 8.7 02/01/2020   HGB 14.0 02/01/2020   HCT 41.0 02/01/2020   MCV 81.6 02/01/2020   PLT 186.0 02/01/2020   Lab Results  Component Value Date   CREATININE 0.72 07/30/2020   BUN 15 07/30/2020   NA 137 07/30/2020   K 3.9 07/30/2020   CL 98 07/30/2020   CO2 28 07/30/2020   Lab Results  Component Value Date   ALT 21 02/01/2020   AST 18 02/01/2020   ALKPHOS 72 02/01/2020   BILITOT 1.8 (H) 02/01/2020   Lab Results  Component Value Date   CHOL 176 07/30/2020   Lab Results  Component Value Date   HDL 43.70 07/30/2020   Lab Results  Component Value Date   LDLCALC 98 02/01/2020   Lab Results  Component Value Date   TRIG 203.0 (H) 07/30/2020   Lab Results  Component Value Date   CHOLHDL 4 07/30/2020   Lab Results  Component Value Date   HGBA1C 5.6 02/01/2020   ASSESSMENT AND PLAN:   1) HTN: Lytes/cr today.  2) Health maintenance exam: Reviewed age and gender appropriate health maintenance issues (prudent diet, regular exercise, health risks of tobacco and excessive alcohol, use of seatbelts, fire alarms in home, use of sunscreen).  Also reviewed age and gender appropriate health screening as well as vaccine recommendations. Vaccines: Shingrix->***.  Tdap-->***.  Flu-->***. Labs: FLP, CBC, CMET, Hba1c (IFG, HLD, HTN)  ordered. Cervical ca screening: no longer indicated. Breast ca screening: ***(Repeat mammogram was due May 2022--no results in EMR.) Colon ca screening: due for repeat cologuard->***. Osteoporosis screening: DEXA 06/2019 T score -2.0.  Plan repeat after 06/2021.   An After Visit Summary was printed and given to the patient.  FOLLOW UP:  No follow-ups  on file.  Signed:  Santiago Bumpers, MD           02/04/2021

## 2021-02-19 ENCOUNTER — Other Ambulatory Visit: Payer: Self-pay

## 2021-02-19 ENCOUNTER — Ambulatory Visit: Payer: Medicare Other

## 2021-04-04 ENCOUNTER — Encounter: Payer: Self-pay | Admitting: Family Medicine

## 2021-05-14 ENCOUNTER — Encounter: Payer: Self-pay | Admitting: Family Medicine

## 2021-07-07 NOTE — Telephone Encounter (Signed)
LVM for pt to call back in regards to scheduling AWV with our health coach.   Note: Please remind pt to complete Cologuard   07/07/2021

## 2021-07-10 ENCOUNTER — Telehealth: Payer: Self-pay

## 2021-07-10 NOTE — Telephone Encounter (Signed)
Spoke with pt to see if received cologuard kit. Pt stated that she has and will try to complete in next couple days.

## 2021-07-21 ENCOUNTER — Telehealth: Payer: Self-pay

## 2021-07-21 NOTE — Telephone Encounter (Signed)
LVM for pt to schedule CPE appt.

## 2021-07-30 ENCOUNTER — Ambulatory Visit (INDEPENDENT_AMBULATORY_CARE_PROVIDER_SITE_OTHER): Payer: Medicare Other

## 2021-07-30 DIAGNOSIS — Z1231 Encounter for screening mammogram for malignant neoplasm of breast: Secondary | ICD-10-CM

## 2021-07-30 DIAGNOSIS — Z0000 Encounter for general adult medical examination without abnormal findings: Secondary | ICD-10-CM

## 2021-07-30 DIAGNOSIS — E2839 Other primary ovarian failure: Secondary | ICD-10-CM

## 2021-07-30 NOTE — Progress Notes (Signed)
Virtual Visit via Telephone Note  I connected with  Sandy Gill on 07/30/21 at  9:45 AM EDT by telephone and verified that I am speaking with the correct person using two identifiers.  Medicare Annual Wellness visit completed telephonically due to Covid-19 pandemic.   Persons participating in this call: This Health Coach and this patient.   Location: Patient: Home Provider: Office    I discussed the limitations, risks, security and privacy concerns of performing an evaluation and management service by telephone and the availability of in person appointments. The patient expressed understanding and agreed to proceed.  Unable to perform video visit due to video visit attempted and failed and/or patient does not have video capability.   Some vital signs may be absent or patient reported.   Willette Brace, LPN   Subjective:   Sandy Gill is a 76 y.o. female who presents for Medicare Annual (Subsequent) preventive examination.  Review of Systems     Cardiac Risk Factors include: advanced age (>32men, >62 women);hypertension     Objective:    There were no vitals filed for this visit. There is no height or weight on file to calculate BMI.     07/30/2021    9:52 AM 02/14/2020    9:06 AM 05/08/2016    8:41 AM  Advanced Directives  Does Patient Have a Medical Advance Directive? Yes Yes No  Type of Paramedic of [place];Living will   Copy of [place] in Chart? No - copy requested No - copy requested   Would patient like information on creating a medical advance directive?   Yes (MAU/Ambulatory/Procedural Areas - Information given)    Current Medications (verified) Outpatient Encounter Medications as of 07/30/2021  Medication Sig   bisoprolol-hydrochlorothiazide (ZIAC) 5-6.25 MG tablet TAKE 2 TABLET EVERY MORNING AND 1 TABLET EVERY EVENING   diphenhydrAMINE (BENADRYL) 25 MG tablet Take 25 mg by  mouth every 6 (six) hours as needed.   meloxicam (MOBIC) 7.5 MG tablet TAKE 1 TABLET BY MOUTH  DAILY AS NEEDED FOR PAIN   Multiple Vitamins-Minerals (WOMENS MULTIVITAMIN PO) Take by mouth.   Omega-3 Fatty Acids (OMEGA 3 PO) Take 1 tablet by mouth.   No facility-administered encounter medications on file as of 07/30/2021.    Allergies (verified) Penicillins and Sulfonamide derivatives   History: Past Medical History:  Diagnosis Date   Allergic rhinitis    Fall>spring   COVID-19 virus infection 08/26/2020   Headache(784.0)    Hyperlipemia, mixed 08/2018   Statin recommended 08/2018.   Hypertension    IFG (impaired fasting glucose)    HbA1c 5.3%-5.4% 2016-2018; 5.6% 2021.   Kidney stones    x 1 episode   Left rib fracture 2017   Migraines    when "younger"   Osteoarthritis    Hands, feet, knees: takes meloxicam occas   Osteopenia 06/2019   T score -2.0.  Plan rpt 06/2021.   Right shoulder injury 12/2014   R AC joint arthritis--steroid injection by ortho/sportsmed MD helped.  Pt also has full thickness RC tear on right.   Past Surgical History:  Procedure Laterality Date   COLONOSCOPY  08/02/06   DEXA  06/2019   T score -2.0. Rpt 2 yrs   Family History  Problem Relation Age of Onset   Arthritis Mother    Hypertension Mother    Arthritis Father    Heart disease Father    Hypertension Father  AAA (abdominal aortic aneurysm) Father        age of death 14   Arthritis Sister    Hypertension Sister    Hypertension Other    Coronary artery disease Other    Social History   Socioeconomic History   Marital status: Widowed    Spouse name: Not on file   Number of children: Not on file   Years of education: Not on file   Highest education level: Not on file  Occupational History   Occupation: retired  Tobacco Use   Smoking status: Never   Smokeless tobacco: Never  Substance and Sexual Activity   Alcohol use: No   Drug use: No   Sexual activity: Not on file  Other  Topics Concern   Not on file  Social History Narrative   Widow (husband Butch d 2020), 1 daughter, 2 grandchildren (grand-daughter lives with her).      Occupation: Medical illustrator   No T/A/Ds.   Social Determinants of Health   Financial Resource Strain: Low Risk  (07/30/2021)   Overall Financial Resource Strain (CARDIA)    Difficulty of Paying Living Expenses: Not hard at all  Food Insecurity: No Food Insecurity (07/30/2021)   Hunger Vital Sign    Worried About Running Out of Food in the Last Year: Never true    Ran Out of Food in the Last Year: Never true  Transportation Needs: No Transportation Needs (07/30/2021)   PRAPARE - Hydrologist (Medical): No    Lack of Transportation (Non-Medical): No  Physical Activity: Sufficiently Active (07/30/2021)   Exercise Vital Sign    Days of Exercise per Week: 7 days    Minutes of Exercise per Session: 30 min  Stress: No Stress Concern Present (07/30/2021)   Vera    Feeling of Stress : Not at all  Social Connections: Moderately Integrated (07/30/2021)   Social Connection and Isolation Panel [NHANES]    Frequency of Communication with Friends and Family: More than three times a week    Frequency of Social Gatherings with Friends and Family: More than three times a week    Attends Religious Services: More than 4 times per year    Active Member of [employer] or Organizations: Yes    Attends Archivist Meetings: 1 to 4 times per year    Marital Status: Widowed    Tobacco Counseling Counseling given: Not Answered   Clinical Intake:  Pre-visit preparation completed: Yes  Pain : No/denies pain     BMI - recorded: 24.33 Nutritional Status: BMI of 19-24  Normal Nutritional Risks: None Diabetes: No  How often do you need to have someone help you when you read instructions, pamphlets, or other written materials from your doctor or  pharmacy?: 1 - Never  Diabetic?no  Interpreter Needed?: No  Information entered by :: Charlott Rakes, LPN   Activities of Daily Living    07/30/2021    9:53 AM  In your present state of health, do you have any difficulty performing the following activities:  Hearing? 0  Vision? 0  Difficulty concentrating or making decisions? 0  Walking or climbing stairs? 0  Dressing or bathing? 0  Doing errands, shopping? 0  Preparing Food and eating ? N  Using the Toilet? N  In the past six months, have you accidently leaked urine? N  Do you have problems with loss of bowel control? N  Managing your  Medications? N  Managing your Finances? N  Housekeeping or managing your Housekeeping? N    Patient Care Team: Tammi Sou, MD as PCP - General (Family Medicine) Silverio Decamp, MD as Consulting Physician (Sports Medicine)  Indicate any recent Medical Services you may have received from other than [facility] providers in the past year (date may be approximate).     Assessment:   This is a routine wellness examination for Sandy Gill.  Hearing/Vision screen Hearing Screening - Comments:: Pt stated HOH  Vision Screening - Comments:: Pt follows up with provider for annual eye exams   Dietary issues and exercise activities discussed: Current Exercise Habits: Home exercise routine, Type of exercise: walking, Time (Minutes): 30, Frequency (Times/Week): 7, Weekly Exercise (Minutes/Week): 210   Goals Addressed             This Visit's Progress    Patient Stated       None at this time        Depression Screen    07/30/2021    9:50 AM 02/14/2020    9:09 AM 02/01/2020    9:12 AM 08/26/2018    1:53 PM 08/13/2017   12:55 PM 05/08/2016    8:42 AM 06/20/2014    1:12 PM  PHQ 2/9 Scores  PHQ - 2 Score 0 0 0 0 0 0 0    Fall Risk    07/30/2021    9:53 AM 02/14/2020    9:08 AM 02/01/2020    9:13 AM 08/26/2018    1:53 PM 08/13/2017   12:55 PM  Fall Risk   Falls in the past year? 0 0 0  0 No  Number falls in past yr: 0 0 0 0   Injury with Fall? 0 0 0 0   Risk for fall due to : Impaired vision      Follow up Falls prevention discussed Falls prevention discussed Falls evaluation completed      FALL RISK PREVENTION PERTAINING TO THE HOME:  Any stairs in or around the home? No  If so, are there any without handrails? No  Home free of loose throw rugs in walkways, pet beds, electrical cords, etc? Yes  Adequate lighting in your home to reduce risk of falls? Yes   ASSISTIVE DEVICES UTILIZED TO PREVENT FALLS:  Life alert? No  Use of a cane, walker or w/c? No  Grab bars in the bathroom? No  Shower chair or bench in shower? No  Elevated toilet seat or a handicapped toilet? No   TIMED UP AND GO:  Was the test performed? No .   Cognitive Function:        07/30/2021    9:54 AM  6CIT Screen  What Year? 0 points  What month? 0 points  What time? 0 points  Count back from 20 0 points  Months in reverse 0 points  Repeat phrase 0 points  Total Score 0 points    Immunizations Immunization History  Administered Date(s) Administered   Fluad Quad(high Dose 65+) 11/23/2019   Influenza Split 01/08/2012   Influenza Whole 02/02/2005, 11/25/2007, 10/30/2008, 12/10/2009   Influenza, High Dose Seasonal PF 12/21/2012, 12/18/2013, 11/16/2014, 11/06/2016, 11/28/2017   Influenza, Quadrivalent, Recombinant, Inj, Pf 11/13/2018, 11/14/2018   Influenza,inj,Quad PF,6+ Mos 12/13/2015   Influenza-Unspecified 11/06/2017   Pneumococcal Conjugate-13 07/12/2014   Pneumococcal Polysaccharide-23 05/08/2016   Td 08/19/2009   Zoster, Live 07/27/2011    TDAP status: Due, Education has been provided regarding the importance of this vaccine. Advised  may receive this vaccine at local pharmacy or Health Dept. Aware to provide a copy of the vaccination record if obtained from local pharmacy or Health Dept. Verbalized acceptance and understanding.  Flu Vaccine status: Declined, Education has  been provided regarding the importance of this vaccine but patient still declined. Advised may receive this vaccine at local pharmacy or Health Dept. Aware to provide a copy of the vaccination record if obtained from local pharmacy or Health Dept. Verbalized acceptance and understanding.  Pneumococcal vaccine status: Up to date  Covid-19 vaccine status: Declined, Education has been provided regarding the importance of this vaccine but patient still declined. Advised may receive this vaccine at local pharmacy or Health Dept.or vaccine clinic. Aware to provide a copy of the vaccination record if obtained from local pharmacy or Health Dept. Verbalized acceptance and understanding.  Qualifies for Shingles Vaccine? Yes   Zostavax completed No   Shingrix Completed?: No.    Education has been provided regarding the importance of this vaccine. Patient has been advised to call insurance company to determine out of pocket expense if they have not yet received this vaccine. Advised may also receive vaccine at local pharmacy or Health Dept. Verbalized acceptance and understanding.  Screening Tests Health Maintenance  Topic Date Due   TETANUS/TDAP  08/20/2019   Fecal DNA (Cologuard)  03/19/2020   MAMMOGRAM  06/14/2020   DEXA SCAN  06/07/2021   COVID-19 Vaccine (1) 08/15/2021 (Originally 02/13/1946)   Zoster Vaccines- Shingrix (1 of 2) 10/30/2021 (Originally 08/12/1996)   INFLUENZA VACCINE  09/02/2021   Pneumonia Vaccine 51+ Years old  Completed   Hepatitis C Screening  Completed   HPV VACCINES  Aged Out    Health Maintenance  Health Maintenance Due  Topic Date Due   TETANUS/TDAP  08/20/2019   Fecal DNA (Cologuard)  03/19/2020   MAMMOGRAM  06/14/2020   DEXA SCAN  06/07/2021     Colorectal cancer screening: Type of screening: Cologuard. Completed 03/19/17. Repeat every 3 years pt stated she has the kit and will send it in   Mammogram status: Ordered 07/30/21. Pt provided with contact info and  advised to call to schedule appt.   Bone Density status: Ordered 07/30/21. Pt provided with contact info and advised to call to schedule appt.  Additional Screening:  Hepatitis C Screening:  Completed 05/08/16  Vision Screening: Recommended annual ophthalmology exams for early detection of glaucoma and other disorders of the eye. Is the patient up to date with their annual eye exam?  Yes  Who is the provider or what is the name of the office in which the patient attends annual eye exams? Unsure of new providers name  If pt is not established with a provider, would they like to be referred to a provider to establish care? No .   Dental Screening: Recommended annual dental exams for proper oral hygiene  Community Resource Referral / Chronic Care Management: CRR required this visit?  No   CCM required this visit?  No      Plan:     I have personally reviewed and noted the following in the patient's chart:   Medical and social history Use of alcohol, tobacco or illicit drugs  Current medications and supplements including opioid prescriptions.  Functional ability and status Nutritional status Physical activity Advanced directives List of other physicians Hospitalizations, surgeries, and ER visits in previous 12 months Vitals Screenings to include cognitive, depression, and falls Referrals and appointments  In addition, I have reviewed and  discussed with patient certain preventive protocols, quality metrics, and best practice recommendations. A written personalized care plan for preventive services as well as general preventive health recommendations were provided to patient.     Willette Brace, LPN   [id number]/[id number]   Nurse Notes: None

## 2021-07-30 NOTE — Patient Instructions (Signed)
Sandy Gill , Thank you for taking time to come for your Medicare Wellness Visit. I appreciate your ongoing commitment to your health goals. Please review the following plan we discussed and let me know if I can assist you in the future.   Screening recommendations/referrals: Colonoscopy: completed 03/19/17 pt stated she will send in kit when completed  Mammogram: order placed 07/30/21 Bone Density: Order placed 07/30/21 Recommended yearly ophthalmology/optometry visit for glaucoma screening and checkup Recommended yearly dental visit for hygiene and checkup  Vaccinations: Influenza vaccine: decline  Pneumococcal vaccine: Up to date Tdap vaccine: due and discussed  Shingles vaccine: Shingrix discussed. Please contact your pharmacy for coverage information.    Covid-19:Declined and discussed   Advanced directives: Please bring a copy of your health care power of attorney and living will to the office at your convenience.  Conditions/risks identified: none at this time   Next appointment: Follow up in one year for your annual wellness visit    Preventive Care 65 Years and Older, Female Preventive care refers to lifestyle choices and visits with your health care provider that can promote health and wellness. What does preventive care include? A yearly physical exam. This is also called an annual well check. Dental exams once or twice a year. Routine eye exams. Ask your health care provider how often you should have your eyes checked. Personal lifestyle choices, including: Daily care of your teeth and gums. Regular physical activity. Eating a healthy diet. Avoiding tobacco and drug use. Limiting alcohol use. Practicing safe sex. Taking low-dose aspirin every day. Taking vitamin and mineral supplements as recommended by your health care provider. What happens during an annual well check? The services and screenings done by your health care provider during your annual well check will  depend on your age, overall health, lifestyle risk factors, and family history of disease. Counseling  Your health care provider may ask you questions about your: Alcohol use. Tobacco use. Drug use. Emotional well-being. Home and relationship well-being. Sexual activity. Eating habits. History of falls. Memory and ability to understand (cognition). Work and work Statistician. Reproductive health. Screening  You may have the following tests or measurements: Height, weight, and BMI. Blood pressure. Lipid and cholesterol levels. These may be checked every 5 years, or more frequently if you are over 22 years old. Skin check. Lung cancer screening. You may have this screening every year starting at age 37 if you have a 30-pack-year history of smoking and currently smoke or have quit within the past 15 years. Fecal occult blood test (FOBT) of the stool. You may have this test every year starting at age 21. Flexible sigmoidoscopy or colonoscopy. You may have a sigmoidoscopy every 5 years or a colonoscopy every 10 years starting at age 23. Hepatitis C blood test. Hepatitis B blood test. Sexually transmitted disease (STD) testing. Diabetes screening. This is done by checking your blood sugar (glucose) after you have not eaten for a while (fasting). You may have this done every 1-3 years. Bone density scan. This is done to screen for osteoporosis. You may have this done starting at age 55. Mammogram. This may be done every 1-2 years. Talk to your health care provider about how often you should have regular mammograms. Talk with your health care provider about your test results, treatment options, and if necessary, the need for more tests. Vaccines  Your health care provider may recommend certain vaccines, such as: Influenza vaccine. This is recommended every year. Tetanus, diphtheria, and acellular pertussis (  Tdap, Td) vaccine. You may need a Td booster every 10 years. Zoster vaccine. You may  need this after age 86. Pneumococcal 13-valent conjugate (PCV13) vaccine. One dose is recommended after age 49. Pneumococcal polysaccharide (PPSV23) vaccine. One dose is recommended after age 21. Talk to your health care provider about which screenings and vaccines you need and how often you need them. This information is not intended to replace advice given to you by your health care provider. Make sure you discuss any questions you have with your health care provider. Document Released: 02/15/2015 Document Revised: 10/09/2015 Document Reviewed: 11/20/2014 Elsevier Interactive Patient Education  2017 [place] Prevention in the Home Falls can cause injuries. They can happen to people of all ages. There are many things you can do to make your home safe and to help prevent falls. What can I do on the outside of my home? Regularly fix the edges of walkways and driveways and fix any cracks. Remove anything that might make you trip as you walk through a door, such as a raised step or threshold. Trim any bushes or trees on the path to your home. Use bright outdoor lighting. Clear any walking paths of anything that might make someone trip, such as rocks or tools. Regularly check to see if handrails are loose or broken. Make sure that both sides of any steps have handrails. Any raised decks and porches should have guardrails on the edges. Have any leaves, snow, or ice cleared regularly. Use sand or salt on walking paths during winter. Clean up any spills in your garage right away. This includes oil or grease spills. What can I do in the bathroom? Use night lights. Install grab bars by the toilet and in the tub and shower. Do not use towel bars as grab bars. Use non-skid mats or decals in the tub or shower. If you need to sit down in the shower, use a plastic, non-slip stool. Keep the floor dry. Clean up any water that spills on the floor as soon as it happens. Remove soap buildup in  the tub or shower regularly. Attach bath mats securely with double-sided non-slip rug tape. Do not have throw rugs and other things on the floor that can make you trip. What can I do in the bedroom? Use night lights. Make sure that you have a light by your bed that is easy to reach. Do not use any sheets or blankets that are too big for your bed. They should not hang down onto the floor. Have a firm chair that has side arms. You can use this for support while you get dressed. Do not have throw rugs and other things on the floor that can make you trip. What can I do in the kitchen? Clean up any spills right away. Avoid walking on wet floors. Keep items that you use a lot in easy-to-reach places. If you need to reach something above you, use a strong step stool that has a grab bar. Keep electrical cords out of the way. Do not use floor polish or wax that makes floors slippery. If you must use wax, use non-skid floor wax. Do not have throw rugs and other things on the floor that can make you trip. What can I do with my stairs? Do not leave any items on the stairs. Make sure that there are handrails on both sides of the stairs and use them. Fix handrails that are broken or loose. Make sure that handrails are  as long as the stairways. Check any carpeting to make sure that it is firmly attached to the stairs. Fix any carpet that is loose or worn. Avoid having throw rugs at the top or bottom of the stairs. If you do have throw rugs, attach them to the floor with carpet tape. Make sure that you have a light switch at the top of the stairs and the bottom of the stairs. If you do not have them, ask someone to add them for you. What else can I do to help prevent falls? Wear shoes that: Do not have high heels. Have rubber bottoms. Are comfortable and fit you well. Are closed at the toe. Do not wear sandals. If you use a stepladder: Make sure that it is fully opened. Do not climb a closed  stepladder. Make sure that both sides of the stepladder are locked into place. Ask someone to hold it for you, if possible. Clearly mark and make sure that you can see: Any grab bars or handrails. First and last steps. Where the edge of each step is. Use tools that help you move around (mobility aids) if they are needed. These include: Canes. Walkers. Scooters. Crutches. Turn on the lights when you go into a dark area. Replace any light bulbs as soon as they burn out. Set up your furniture so you have a clear path. Avoid moving your furniture around. If any of your floors are uneven, fix them. If there are any pets around you, be aware of where they are. Review your medicines with your doctor. Some medicines can make you feel dizzy. This can increase your chance of falling. Ask your doctor what other things that you can do to help prevent falls. This information is not intended to replace advice given to you by your health care provider. Make sure you discuss any questions you have with your health care provider. Document Released: 11/15/2008 Document Revised: 06/27/2015 Document Reviewed: 02/23/2014 Elsevier Interactive Patient Education  2017 [employer].

## 2021-08-20 ENCOUNTER — Ambulatory Visit: Payer: PPO

## 2021-09-12 ENCOUNTER — Ambulatory Visit: Payer: Self-pay

## 2021-09-12 NOTE — Patient Instructions (Signed)
Visit Information  Thank you for taking time to visit with me today. Please don't hesitate to contact me if I can be of assistance to you.   Following are the goals we discussed today:   Goals Addressed             This Visit's Progress    COMPLETED: Care Coordination Activities       Care Coordination Interventions: [facility] screening completed - no acute challenges identified at this time Fall risk assessment performed - patient is a low fall risk Determined the patient does not have concerns with medications costs or adherence Discussed the patient may have a medical Advance Directive as part of her will. Education on the importance of advance care planning provided Encouraged the patient to bring a copy in for her medical record. If the patient is unable to locate the document she may request a blank one during an upcoming office visit Introduced care coordination program to the patient - encouraged her to speak with her primary care provider if a referral is ever desired Performed chart review to note patient participated in annual wellness visit 07/30/21        Please call the care guide team at [phone] if you need to schedule an appointment with our care coordination team  If you are experiencing a Mental Health or Behavioral Health Crisis or need someone to talk to, please call 1-800-273-TALK (toll free, 24 hour hotline)  Patient verbalizes understanding of instructions and care plan provided today and agrees to view in MyChart. Active MyChart status and patient understanding of how to access instructions and care plan via MyChart confirmed with patient.     No further follow up required: Please contact your primary care provider as needed  Bevelyn Ngo, BSW, CDP Social Worker, Certified Dementia Practitioner Care Coordination [phone]

## 2021-09-12 NOTE — Patient Outreach (Signed)
  Care Coordination   Initial Visit Note   09/12/2021 Name: QUINTINA HAKEEM MRN: [id number] DOB: 1945-04-03  Louie Boston Trabert is a 76 y.o. year old female who sees McGowen, Maryjean Morn, MD for primary care. I spoke with  Salvadore Farber by phone today  What matters to the patients health and wellness today?  No concerns, doing well    Goals Addressed             This Visit's Progress    COMPLETED: Care Coordination Activities       Care Coordination Interventions: SDoH screening completed - no acute challenges identified at this time Fall risk assessment performed - patient is a low fall risk Determined the patient does not have concerns with medications costs or adherence Discussed the patient may have a medical Advance Directive as part of her will. Education on the importance of advance care planning provided Encouraged the patient to bring a copy in for her medical record. If the patient is unable to locate the document she may request a blank one during an upcoming office visit Introduced care coordination program to the patient - encouraged her to speak with her primary care provider if a referral is ever desired Performed chart review to note patient participated in annual wellness visit 07/30/21        SDOH assessments and interventions completed:  Yes  SDOH Interventions Today    Flowsheet Row Most Recent Value  SDOH Interventions   Food Insecurity Interventions Intervention Not Indicated  Housing Interventions Intervention Not Indicated  Transportation Interventions Intervention Not Indicated        Care Coordination Interventions Activated:  Yes  Care Coordination Interventions:  Yes, provided   Follow up plan: No further intervention required.   Encounter Outcome:  Pt. Visit Completed   Bevelyn Ngo, BSW, CDP Social Worker, Certified Dementia Practitioner Care Coordination [phone]

## 2021-09-12 NOTE — Patient Outreach (Signed)
  Care Coordination   09/12/2021 Name: Sandy Gill MRN: [id number] DOB: 04-17-1945   Care Coordination Outreach Attempts:  An unsuccessful telephone outreach was attempted today to offer the patient information about available care coordination services as a benefit of their health plan.   Follow Up Plan:  Additional outreach attempts will be made to offer the patient care coordination information and services.   Encounter Outcome:  No Answer  Care Coordination Interventions Activated:  No   Care Coordination Interventions:  No, not indicated    Bevelyn Ngo, BSW, CDP Social Worker, Certified Dementia Practitioner Care Coordination [phone]

## 2021-10-07 ENCOUNTER — Telehealth: Payer: Self-pay

## 2021-10-07 MED ORDER — BISOPROLOL-HYDROCHLOROTHIAZIDE 5-6.25 MG PO TABS: ORAL_TABLET | ORAL | 0 refills | Status: DC

## 2021-10-07 NOTE — Telephone Encounter (Signed)
Patient advised rx sent 

## 2021-10-07 NOTE — Telephone Encounter (Signed)
Patient refill request.  New preferred pharmacy.  [facility]  **Please do not send RX to [employer] order.** Patient scheduled office visit with Dr. Milinda Cave for 9/11.  bisoprolol-hydrochlorothiazide (ZIAC) 5-6.25 MG tablet [[id number]]

## 2021-10-13 ENCOUNTER — Ambulatory Visit (INDEPENDENT_AMBULATORY_CARE_PROVIDER_SITE_OTHER): Payer: PPO | Admitting: Family Medicine

## 2021-10-13 ENCOUNTER — Encounter: Payer: Self-pay | Admitting: Family Medicine

## 2021-10-13 VITALS — BP 116/74 | HR 62 | Temp 98.0°F | Ht 62.0 in | Wt 126.6 lb

## 2021-10-13 DIAGNOSIS — E7800 Pure hypercholesterolemia, unspecified: Secondary | ICD-10-CM

## 2021-10-13 DIAGNOSIS — I10 Essential (primary) hypertension: Secondary | ICD-10-CM | POA: Diagnosis not present

## 2021-10-13 DIAGNOSIS — R7301 Impaired fasting glucose: Secondary | ICD-10-CM

## 2021-10-13 DIAGNOSIS — G8929 Other chronic pain: Secondary | ICD-10-CM

## 2021-10-13 DIAGNOSIS — M542 Cervicalgia: Secondary | ICD-10-CM | POA: Diagnosis not present

## 2021-10-13 DIAGNOSIS — Z0000 Encounter for general adult medical examination without abnormal findings: Secondary | ICD-10-CM | POA: Diagnosis not present

## 2021-10-13 LAB — COMPREHENSIVE METABOLIC PANEL
ALT: 18 U/L (ref 0–35)
AST: 21 U/L (ref 0–37)
Albumin: 4.3 g/dL (ref 3.5–5.2)
Alkaline Phosphatase: 84 U/L (ref 39–117)
BUN: 17 mg/dL (ref 6–23)
CO2: 27 mEq/L (ref 19–32)
Calcium: 9.6 mg/dL (ref 8.4–10.5)
Chloride: 100 mEq/L (ref 96–112)
Creatinine, Ser: 0.82 mg/dL (ref 0.40–1.20)
GFR: 70.1 mL/min (ref >60.00)
Glucose, Bld: 106 mg/dL — ABNORMAL HIGH (ref 70–99)
Potassium: 3.7 mEq/L (ref 3.5–5.1)
Sodium: 137 mEq/L (ref 135–145)
Total Bilirubin: 1.1 mg/dL (ref 0.2–1.2)
Total Protein: 7.1 g/dL (ref 6.0–8.3)

## 2021-10-13 LAB — HEMOGLOBIN A1C: Hgb A1c MFr Bld: 5.6 % (ref 4.6–6.5)

## 2021-10-13 LAB — LIPID PANEL
Cholesterol: 179 mg/dL (ref 0–200)
HDL: 44.9 mg/dL (ref >39.00)
LDL Cholesterol: 105 mg/dL — ABNORMAL HIGH (ref 0–99)
NonHDL: 134.18
Total CHOL/HDL Ratio: 4
Triglycerides: 144 mg/dL (ref 0.0–149.0)
VLDL: 28.8 mg/dL (ref 0.0–40.0)

## 2021-10-13 MED ORDER — MELOXICAM 7.5 MG PO TABS: 7.5000 mg | ORAL_TABLET | Freq: Every day | ORAL | 1 refills | Status: DC | PRN

## 2021-10-13 MED ORDER — CYCLOBENZAPRINE HCL 10 MG PO TABS
10.0000 mg | ORAL_TABLET | Freq: Three times a day (TID) | ORAL | 1 refills | Status: DC | PRN
Start: 2021-10-13 — End: 2022-04-23

## 2021-10-13 MED ORDER — BISOPROLOL-HYDROCHLOROTHIAZIDE 5-6.25 MG PO TABS: ORAL_TABLET | ORAL | 1 refills | Status: DC

## 2021-10-13 NOTE — Patient Instructions (Signed)

## 2021-10-13 NOTE — Progress Notes (Signed)
Office Note 10/13/2021  CC:  Chief Complaint  Patient presents with   Hypertension    Pt is fasting   Patient is a 76 y.o. female who is here for annual health maintenance exam and follow-up hypertension, hyperlipidemia, and impaired fasting glucose. A/P as of last visit: "HTN: good control on bisop-hct 5-6.25, 2 qAM and 1 qPM. Lytes/cr today.   HLD: intol of both atorva and prava-->pt declines any further cholesterol medication. Working on improved diet and activity level.   IFG: Hba1c's have been 5.3-5.6% since 2016, most recent was 6 mo ago. OK to start monitoring this lab just once a year at this point.  GLucose level check today. Cont improvements in carb intake"  INTERIM HX: Sandy Gill is feeling well.  Her home blood pressure monitoring shows blood pressures consistently 120s over 70s.  Unfortunately, her 30-year-old daughter just died last month.  Diet is good.  She had to give up chocolate because it was causing severe reflux.  Has chronic mild to moderate neck stiffness for which she uses meloxicam 7.5 mg and cyclobenzaprine 10 mg 3 times daily as needed on a fairly regular basis and this helps.  Past Medical History:  Diagnosis Date   Allergic rhinitis    Fall>spring   COVID-19 virus infection 08/26/2020   Headache(784.0)    Hyperlipemia, mixed 08/2018   Statin recommended 08/2018.   Hypertension    IFG (impaired fasting glucose)    HbA1c 5.3%-5.4% 2016-2018; 5.6% 2021.   Kidney stones    x 1 episode   Left rib fracture 2017   Migraines    when "younger"   Osteoarthritis    Hands, feet, knees: takes meloxicam occas   Osteopenia 06/2019   T score -2.0.  Plan rpt 06/2021.   Right shoulder injury 12/2014   R AC joint arthritis--steroid injection by ortho/sportsmed MD helped.  Pt also has full thickness RC tear on right.    Past Surgical History:  Procedure Laterality Date   COLONOSCOPY  08/02/06   DEXA  06/2019   T score -2.0. Rpt 2 yrs    Family  History  Problem Relation Age of Onset   Arthritis Mother    Hypertension Mother    Arthritis Father    Heart disease Father    Hypertension Father    AAA (abdominal aortic aneurysm) Father        age of death 96   Arthritis Sister    Hypertension Sister    Hypertension Other    Coronary artery disease Other     Social History   Socioeconomic History   Marital status: Widowed    Spouse name: Not on file   Number of children: Not on file   Years of education: Not on file   Highest education level: Not on file  Occupational History   Occupation: retired  Tobacco Use   Smoking status: Never   Smokeless tobacco: Never  Substance and Sexual Activity   Alcohol use: No   Drug use: No   Sexual activity: Not on file  Other Topics Concern   Not on file  Social History Narrative   Widow (husband Butch d 2020), 1 daughter, 2 grandchildren (grand-daughter lives with her).      Occupation: Advertising account planner   No T/A/Ds.   Social Determinants of Health   Financial Resource Strain: Low Risk  (07/30/2021)   Overall Financial Resource Strain (CARDIA)    Difficulty of Paying Living Expenses: Not hard at all  Food Insecurity:  No Food Insecurity (09/12/2021)   Hunger Vital Sign    Worried About Running Out of Food in the Last Year: Never true    Ran Out of Food in the Last Year: Never true  Transportation Needs: No Transportation Needs (09/12/2021)   PRAPARE - Administrator, Civil Service (Medical): No    Lack of Transportation (Non-Medical): No  Physical Activity: Sufficiently Active (07/30/2021)   Exercise Vital Sign    Days of Exercise per Week: 7 days    Minutes of Exercise per Session: 30 min  Stress: No Stress Concern Present (07/30/2021)   [employer] of Occupational Health - Occupational Stress Questionnaire    Feeling of Stress : Not at all  Social Connections: Moderately Integrated (07/30/2021)   Social Connection and Isolation Panel [NHANES]    Frequency  of Communication with Friends and Family: More than three times a week    Frequency of Social Gatherings with Friends and Family: More than three times a week    Attends Religious Services: More than 4 times per year    Active Member of [employer] or Organizations: Yes    Attends Banker Meetings: 1 to 4 times per year    Marital Status: Widowed  Intimate Partner Violence: Not At Risk (07/30/2021)   Humiliation, Afraid, Rape, and Kick questionnaire    Fear of Current or Ex-Partner: No    Emotionally Abused: No    Physically Abused: No    Sexually Abused: No    Outpatient Medications Prior to Visit  Medication Sig Dispense Refill   Cetirizine HCl (ZYRTEC ALLERGY PO) Take by mouth.     diphenhydrAMINE (BENADRYL) 25 MG tablet Take 25 mg by mouth every 6 (six) hours as needed.     Multiple Vitamins-Minerals (WOMENS MULTIVITAMIN PO) Take by mouth.     Omega-3 Fatty Acids (OMEGA 3 PO) Take 1 tablet by mouth.     bisoprolol-hydrochlorothiazide (ZIAC) 5-6.25 MG tablet TAKE 2 TABLET EVERY MORNING AND 1 TABLET EVERY EVENING 90 tablet 0   meloxicam (MOBIC) 7.5 MG tablet TAKE 1 TABLET BY MOUTH  DAILY AS NEEDED FOR PAIN 90 tablet 1   No facility-administered medications prior to visit.    Allergies  Allergen Reactions   Penicillins    Sulfonamide Derivatives     ROS Review of Systems  Constitutional:  Negative for appetite change, chills, fatigue and fever.  HENT:  Negative for congestion, dental problem, ear pain and sore throat.   Eyes:  Negative for discharge, redness and visual disturbance.  Respiratory:  Negative for cough, chest tightness, shortness of breath and wheezing.   Cardiovascular:  Negative for chest pain, palpitations and leg swelling.  Gastrointestinal:  Negative for abdominal pain, blood in stool, diarrhea, nausea and vomiting.  Genitourinary:  Negative for difficulty urinating, dysuria, flank pain, frequency, hematuria and urgency.  Musculoskeletal:  Negative  for arthralgias, back pain, joint swelling, myalgias and neck stiffness.  Skin:  Negative for pallor and rash.  Neurological:  Negative for dizziness, speech difficulty, weakness and headaches.  Hematological:  Negative for adenopathy. Does not bruise/bleed easily.  Psychiatric/Behavioral:  Negative for confusion and sleep disturbance. The patient is not nervous/anxious.     PE;    10/13/2021    9:53 AM 07/30/2020    9:16 AM 02/14/2020    9:00 AM  Vitals with BMI  Height 5\' 2"  5\' 2"  5\' 2"   Weight 126 lbs 10 oz 133 lbs 133 lbs  BMI 23.15 24.32 24.32  Systolic 116 132   Diastolic 74 74   Pulse 62 57    Exam chaperoned by Emi Holes, CMA. Gen: Alert, well appearing.  Patient is oriented to person, place, time, and situation. AFFECT: pleasant, lucid thought and speech. ENT: Ears: EACs clear, normal epithelium.  TMs with good light reflex and landmarks bilaterally.  Eyes: no injection, icteris, swelling, or exudate.  EOMI, PERRLA. Nose: no drainage or turbinate edema/swelling.  No injection or focal lesion.  Mouth: lips without lesion/swelling.  Oral mucosa pink and moist.  Dentition intact and without obvious caries or gingival swelling.  Oropharynx without erythema, exudate, or swelling.  Neck: supple/nontender.  No LAD, mass, or TM.  Carotid pulses 2+ bilaterally, without bruits. CV: RRR, no m/r/g.   LUNGS: CTA bilat, nonlabored resps, good aeration in all lung fields. ABD: soft, NT, ND, BS normal.  No hepatospenomegaly or mass.  No bruits. EXT: no clubbing, cyanosis, or edema.  Musculoskeletal: no joint swelling, erythema, warmth, or tenderness.  ROM of all joints intact. Skin - no sores or suspicious lesions or rashes or color changes   Pertinent labs:  Lab Results  Component Value Date   TSH 0.85 05/08/2016   Lab Results  Component Value Date   WBC 8.7 02/01/2020   HGB 14.0 02/01/2020   HCT 41.0 02/01/2020   MCV 81.6 02/01/2020   PLT 186.0 02/01/2020   Lab Results   Component Value Date   CREATININE 0.72 07/30/2020   BUN 15 07/30/2020   NA 137 07/30/2020   K 3.9 07/30/2020   CL 98 07/30/2020   CO2 28 07/30/2020   Lab Results  Component Value Date   ALT 21 02/01/2020   AST 18 02/01/2020   ALKPHOS 72 02/01/2020   BILITOT 1.8 (H) 02/01/2020   Lab Results  Component Value Date   CHOL 176 07/30/2020   Lab Results  Component Value Date   HDL 43.70 07/30/2020   Lab Results  Component Value Date   LDLCALC 98 02/01/2020   Lab Results  Component Value Date   TRIG 203.0 (H) 07/30/2020   Lab Results  Component Value Date   CHOLHDL 4 07/30/2020   Lab Results  Component Value Date   HGBA1C 5.6 02/01/2020   ASSESSMENT AND PLAN:   #1 hypertension, well controlled on bisoprolol-HCTZ 5-6 0.25, 2 every morning and 1 every evening. Electrolytes and creatinine today.  2.  Chronic musculoskeletal neck pain. She gets good Schultes from meloxicam 7.5 mg daily as needed and Flexeril 10 mg 3 times daily as needed. Prescriptions renewed today.  3. Health maintenance exam: Reviewed age and gender appropriate health maintenance issues (prudent diet, regular exercise, health risks of tobacco and excessive alcohol, use of seatbelts, fire alarms in home, use of sunscreen).  Also reviewed age and gender appropriate health screening as well as vaccine recommendations. Vaccines: Shingrix-> declined.  Tdap->declined.  Flu->declined.  Otherwise ALL UTD. Labs:  Fasting cmet, flp, and a1c (IFG). Cervical ca screening: no longer a candidate for cerv ca screening. Breast ca screening: ([facility]) next mammogram has been ordered as of June 2023. Colon ca screening: cologuard neg 2019, repeat has been ordered. Osteoporosis screening: DEXA 06/2019 T score -2.0.  Repeat DEXA ordered as of June 2023.  An After Visit Summary was printed and given to the patient.  FOLLOW UP:  Return in about 6 months (around 04/13/2022) for f/u htn.  Signed:  Santiago Bumpers,  MD  10/13/2021  

## 2021-11-19 ENCOUNTER — Encounter: Payer: Self-pay | Admitting: Family Medicine

## 2021-11-19 ENCOUNTER — Ambulatory Visit (INDEPENDENT_AMBULATORY_CARE_PROVIDER_SITE_OTHER): Payer: PPO | Admitting: Family Medicine

## 2021-11-19 VITALS — BP 120/71 | HR 66 | Temp 98.0°F | Ht 62.0 in | Wt 128.4 lb

## 2021-11-19 DIAGNOSIS — H6121 Impacted cerumen, right ear: Secondary | ICD-10-CM

## 2021-11-19 NOTE — Progress Notes (Signed)
OFFICE VISIT  11/19/2021  CC:  Chief Complaint  Patient presents with   Ear Pain    Mainly right, x5 days. Feels plugged, has been using hydrogen peroxide to help.     Patient is a 76 y.o. female who presents for ear pain.  HPI: 5d ago after cleaning ear with Qtip it felt clogged and dec hearing. No pain. L ear feels normal  Has chronic bilat hearing deficit   Past Medical History:  Diagnosis Date   Allergic rhinitis    Fall>spring   COVID-19 virus infection 08/26/2020   Headache(784.0)    Hyperlipemia, mixed 08/2018   Statin recommended 08/2018.   Hypertension    IFG (impaired fasting glucose)    HbA1c 5.3%-5.4% 2016-2018; 5.6% 2021.   Kidney stones    x 1 episode   Left rib fracture 2017   Migraines    when "younger"   Osteoarthritis    Hands, feet, knees: takes meloxicam occas   Osteopenia 06/2019   T score -2.0.  Plan rpt 06/2021.   Right shoulder injury 12/2014   R AC joint arthritis--steroid injection by ortho/sportsmed MD helped.  Pt also has full thickness RC tear on right.    Past Surgical History:  Procedure Laterality Date   COLONOSCOPY  08/02/06   DEXA  06/2019   T score -2.0. Rpt 2 yrs    Outpatient Medications Prior to Visit  Medication Sig Dispense Refill   bisoprolol-hydrochlorothiazide (ZIAC) 5-6.25 MG tablet TAKE 2 TABLET EVERY MORNING AND 1 TABLET EVERY EVENING 270 tablet 1   Cetirizine HCl (ZYRTEC ALLERGY PO) Take by mouth.     cyclobenzaprine (FLEXERIL) 10 MG tablet Take 1 tablet (10 mg total) by mouth 3 (three) times daily as needed for muscle spasms. 90 tablet 1   diphenhydrAMINE (BENADRYL) 25 MG tablet Take 25 mg by mouth every 6 (six) hours as needed.     meloxicam (MOBIC) 7.5 MG tablet Take 1 tablet (7.5 mg total) by mouth daily as needed. for pain 90 tablet 1   Multiple Vitamins-Minerals (WOMENS MULTIVITAMIN PO) Take by mouth.     Omega-3 Fatty Acids (OMEGA 3 PO) Take 1 tablet by mouth.     No facility-administered medications  prior to visit.    Allergies  Allergen Reactions   Penicillins    Sulfonamide Derivatives     ROS As per HPI  PE:    11/19/2021    4:06 PM 10/13/2021    9:53 AM 07/30/2020    9:16 AM  Vitals with BMI  Height 5\' 2"  5\' 2"  5\' 2"   Weight 128 lbs 6 oz 126 lbs 10 oz 133 lbs  BMI 23.48 41.32 44.01  Systolic 027 253 664  Diastolic 71 74 74  Pulse 66 62 57     Physical Exam  R EAC with 100% impaction light brown cerumen. L EAC clear, TM normal.  LABS:  None  IMPRESSION AND PLAN:  Complete right ear cerumen impaction. Irrigated successfully today.  Consent obtained. Procedure: Cerumen Disimpaction  Warm water was applied and gentle ear lavage performed on right. There were no complications and following the disimpaction. Tympanic membrane intact following the procedure.    The patient reported relief of symptoms after removal of cerumen    An After Visit Summary was printed and given to the patient.  FOLLOW UP: No follow-ups on file.  Signed:  Crissie Sickles, MD           11/19/2021

## 2022-03-18 ENCOUNTER — Other Ambulatory Visit: Payer: Self-pay | Admitting: Family Medicine

## 2022-03-18 DIAGNOSIS — Z1231 Encounter for screening mammogram for malignant neoplasm of breast: Secondary | ICD-10-CM

## 2022-03-31 ENCOUNTER — Other Ambulatory Visit: Payer: Self-pay | Admitting: Family Medicine

## 2022-04-13 ENCOUNTER — Ambulatory Visit: Payer: PPO | Admitting: Family Medicine

## 2022-04-27 ENCOUNTER — Ambulatory Visit (INDEPENDENT_AMBULATORY_CARE_PROVIDER_SITE_OTHER): Payer: PPO | Admitting: Family Medicine

## 2022-04-27 ENCOUNTER — Encounter: Payer: Self-pay | Admitting: Family Medicine

## 2022-04-27 VITALS — BP 143/72 | HR 68 | Temp 98.2°F | Ht 62.0 in | Wt 127.2 lb

## 2022-04-27 DIAGNOSIS — R7301 Impaired fasting glucose: Secondary | ICD-10-CM

## 2022-04-27 DIAGNOSIS — Z23 Encounter for immunization: Secondary | ICD-10-CM

## 2022-04-27 DIAGNOSIS — I10 Essential (primary) hypertension: Secondary | ICD-10-CM

## 2022-04-27 DIAGNOSIS — E7800 Pure hypercholesterolemia, unspecified: Secondary | ICD-10-CM | POA: Diagnosis not present

## 2022-04-27 DIAGNOSIS — R196 Halitosis: Secondary | ICD-10-CM

## 2022-04-27 DIAGNOSIS — M8580 Other specified disorders of bone density and structure, unspecified site: Secondary | ICD-10-CM | POA: Diagnosis not present

## 2022-04-27 DIAGNOSIS — K580 Irritable bowel syndrome with diarrhea: Secondary | ICD-10-CM

## 2022-04-27 DIAGNOSIS — E2839 Other primary ovarian failure: Secondary | ICD-10-CM | POA: Diagnosis not present

## 2022-04-27 DIAGNOSIS — K219 Gastro-esophageal reflux disease without esophagitis: Secondary | ICD-10-CM

## 2022-04-27 DIAGNOSIS — Z1211 Encounter for screening for malignant neoplasm of colon: Secondary | ICD-10-CM

## 2022-04-27 LAB — BASIC METABOLIC PANEL
BUN: 20 mg/dL (ref 6–23)
CO2: 29 mEq/L (ref 19–32)
Calcium: 10.1 mg/dL (ref 8.4–10.5)
Chloride: 97 mEq/L (ref 96–112)
Creatinine, Ser: 0.81 mg/dL (ref 0.40–1.20)
GFR: 70.87 mL/min (ref >60.00)
Glucose, Bld: 123 mg/dL — ABNORMAL HIGH (ref 70–99)
Potassium: 3.7 mEq/L (ref 3.5–5.1)
Sodium: 139 mEq/L (ref 135–145)

## 2022-04-27 LAB — LIPID PANEL
Cholesterol: 214 mg/dL — ABNORMAL HIGH (ref 0–200)
HDL: 55.7 mg/dL (ref >39.00)
LDL Cholesterol: 124 mg/dL — ABNORMAL HIGH (ref 0–99)
NonHDL: 158.45
Total CHOL/HDL Ratio: 4
Triglycerides: 173 mg/dL — ABNORMAL HIGH (ref 0.0–149.0)
VLDL: 34.6 mg/dL (ref 0.0–40.0)

## 2022-04-27 LAB — HEMOGLOBIN A1C: Hgb A1c MFr Bld: 5.6 % (ref 4.6–6.5)

## 2022-04-27 MED ORDER — CYCLOBENZAPRINE HCL 10 MG PO TABS: 10.0000 mg | ORAL_TABLET | Freq: Three times a day (TID) | ORAL | 1 refills | Status: DC | PRN

## 2022-04-27 MED ORDER — PANTOPRAZOLE SODIUM 40 MG PO TBEC: 40.0000 mg | DELAYED_RELEASE_TABLET | Freq: Every day | ORAL | 1 refills | Status: DC

## 2022-04-27 MED ORDER — TETANUS-DIPHTH-ACELL PERTUSSIS 5-2.5-18.5 LF-MCG/0.5 IM SUSP: 0.5000 mL | Freq: Once | INTRAMUSCULAR | 0 refills | Status: AC

## 2022-04-27 MED ORDER — BISOPROLOL-HYDROCHLOROTHIAZIDE 5-6.25 MG PO TABS: ORAL_TABLET | ORAL | 1 refills | Status: DC

## 2022-04-27 MED ORDER — ZOSTER VAC RECOMB ADJUVANTED 50 MCG/0.5ML IM SUSR: 0.5000 mL | Freq: Once | INTRAMUSCULAR | 0 refills | Status: AC

## 2022-04-27 NOTE — Progress Notes (Signed)
OFFICE VISIT  04/27/2022  CC:  Chief Complaint  Patient presents with   Medical Management of Chronic Issues    Pt is fasting    Patient is a 77 y.o. female who presents for 23-month follow-up hypertension. A/P as of last visit: " hypertension, well controlled on bisoprolol-HCTZ 5-6 0.25, 2 every morning and 1 every evening. Electrolytes and creatinine today.   2.  Chronic musculoskeletal neck pain. She gets good results from meloxicam 7.5 mg daily as needed and Flexeril 10 mg 3 times daily as needed. Prescriptions renewed today.   3. Health maintenance exam: Reviewed age and gender appropriate health maintenance issues (prudent diet, regular exercise, health risks of tobacco and excessive alcohol, use of seatbelts, fire alarms in home, use of sunscreen).  Also reviewed age and gender appropriate health screening as well as vaccine recommendations. Vaccines: Shingrix-> declined.  Tdap->declined.  Flu->declined.  Otherwise ALL UTD. Labs:  Fasting cmet, flp, and a1c (IFG). Cervical ca screening: no longer a candidate for cerv ca screening. Breast ca screening: ([facility]) next mammogram has been ordered as of June 2023. Colon ca screening: cologuard neg 2019, repeat has been ordered. Osteoporosis screening: DEXA 06/2019 T score -2.0.  Repeat DEXA ordered as of June 2023."  INTERIM HX:  #1 says she has had about a 10-year history of random episodes of frequent diarrhea that last anywhere from 1 to 3 weeks typically.  Describes foul-smelling gas.  Does not seem to matter what food she eats.  The diarrhea episodes stop once she takes 1 dose of Imodium.  No abdominal pain.  No periods of constipation.  No fever, blood in stool, or weight loss.  She notes a family history of irritable bowel syndrome.  #2 states she has bad gastroesophageal reflux at times.  She narrowed this down to her intake of chocolate.  If she avoids chocolate she does not feel reflux. She does however complain  of a chronic problem of bad breath.  ROS as above, plus-->  no CP, no SOB, no wheezing, no cough, no dizziness, no HAs, no rashes, no melena/hematochezia.  No polyuria or polydipsia.  No myalgias or arthralgias.  No focal weakness, paresthesias, or tremors.  No acute vision or hearing abnormalities.  No dysuria or unusual/new urinary urgency or frequency.  No recent changes in lower legs.  No palpitations.    Past Medical History:  Diagnosis Date   Allergic rhinitis    Fall>spring   COVID-19 virus infection 08/26/2020   Headache(784.0)    Hyperlipemia, mixed 08/2018   Statin recommended 08/2018.   Hypertension    IFG (impaired fasting glucose)    HbA1c 5.3%-5.4% 2016-2018; 5.6% 2021.   Kidney stones    x 1 episode   Left rib fracture 2017   Migraines    when "younger"   Osteoarthritis    Hands, feet, knees: takes meloxicam occas   Osteopenia 06/2019   T score -2.0.  Plan rpt 06/2021.   Right shoulder injury 12/2014   R AC joint arthritis--steroid injection by ortho/sportsmed MD helped.  Pt also has full thickness RC tear on right.    Past Surgical History:  Procedure Laterality Date   COLONOSCOPY  08/02/06   DEXA  06/2019   T score -2.0. Rpt 2 yrs    Outpatient Medications Prior to Visit  Medication Sig Dispense Refill   Cetirizine HCl (ZYRTEC ALLERGY PO) Take by mouth.     diphenhydrAMINE (BENADRYL) 25 MG tablet Take 25 mg by mouth every  6 (six) hours as needed.     meloxicam (MOBIC) 7.5 MG tablet TAKE 1 TABLET (7.5 MG TOTAL) BY MOUTH DAILY AS NEEDED. FOR PAIN 30 tablet 1   Multiple Vitamins-Minerals (WOMENS MULTIVITAMIN PO) Take by mouth.     Omega-3 Fatty Acids (OMEGA 3 PO) Take 1 tablet by mouth.     bisoprolol-hydrochlorothiazide (ZIAC) 5-6.25 MG tablet TAKE 2 TABLET EVERY MORNING AND 1 TABLET EVERY EVENING 270 tablet 1   cyclobenzaprine (FLEXERIL) 10 MG tablet Take 1 tablet (10 mg total) by mouth 3 (three) times daily as needed for muscle spasms. 90 tablet 1   No  facility-administered medications prior to visit.    Allergies  Allergen Reactions   Penicillins    Sulfonamide Derivatives     Review of Systems As per HPI  PE:    04/27/2022   10:38 AM 11/19/2021    4:06 PM 10/13/2021    9:53 AM  Vitals with BMI  Height 5\' 2"  5\' 2"  5\' 2"   Weight 127 lbs 3 oz 128 lbs 6 oz 126 lbs 10 oz  BMI 23.26 [id number]  Systolic [id number]  Diastolic 72 71 74  Pulse 68 66 62  Initial bp today 143/72 Rpt 134/70  Physical Exam  Gen: Alert, well appearing.  Patient is oriented to person, place, time, and situation. AFFECT: pleasant, lucid thought and speech. No further exam today.  LABS:  Last CBC Lab Results  Component Value Date   WBC 8.7 02/01/2020   HGB 14.0 02/01/2020   HCT 41.0 02/01/2020   MCV 81.6 02/01/2020   MCH 28.2 08/26/2018   RDW 14.1 02/01/2020   PLT 186.0 [id number]   Last metabolic panel Lab Results  Component Value Date   GLUCOSE 123 (H) 04/27/2022   NA 139 04/27/2022   K 3.7 04/27/2022   CL 97 04/27/2022   CO2 29 04/27/2022   BUN 20 04/27/2022   CREATININE 0.81 04/27/2022   CALCIUM 10.1 04/27/2022   PROT 7.1 10/13/2021   ALBUMIN 4.3 10/13/2021   BILITOT 1.1 10/13/2021   ALKPHOS 84 10/13/2021   AST 21 10/13/2021   ALT 18 10/13/2021   Last lipids Lab Results  Component Value Date   CHOL 214 (H) 04/27/2022   HDL 55.70 04/27/2022   LDLCALC 124 (H) 04/27/2022   LDLDIRECT 100.0 07/30/2020   TRIG 173.0 (H) 04/27/2022   CHOLHDL 4 04/27/2022   Last hemoglobin A1c Lab Results  Component Value Date   HGBA1C 5.6 04/27/2022   Last thyroid functions Lab Results  Component Value Date   TSH 0.85 05/08/2016   IMPRESSION AND PLAN:  #1 hypertension, well-controlled on bisoprolol/HCTZ 5/6.25, 2 tabs in the morning and 1 in the evening. Electrolytes and creatinine today.  2.  Hyperlipidemia.  Statin has been recommended in the past. Fasting lipid panel today.  3.  Impaired fasting glucose. Fasting glucose  and hemoglobin A1c today.  4.  IBS with diarrhea. Reassured.  Okay to use Imodium as needed.  5.  Gastroesophageal reflux disease: Mostly "silent". Start pantoprazole 40 mg daily and recheck in 1 month.  #6 halitosis. See #5 above.  An After Visit Summary was printed and given to the patient.  FOLLOW UP: Return in about 4 weeks (around 05/25/2022) for f/u GERD.  Signed:  Crissie Sickles, MD           04/27/2022

## 2022-05-05 ENCOUNTER — Other Ambulatory Visit: Payer: Self-pay | Admitting: Family Medicine

## 2022-05-15 ENCOUNTER — Ambulatory Visit
Admission: RE | Admit: 2022-05-15 | Discharge: 2022-05-15 | Disposition: A | Discharge status: Home / Self Care | Payer: PPO | Source: Ambulatory Visit | Attending: Family Medicine | Admitting: Family Medicine

## 2022-05-15 DIAGNOSIS — Z1231 Encounter for screening mammogram for malignant neoplasm of breast: Secondary | ICD-10-CM

## 2022-05-19 ENCOUNTER — Other Ambulatory Visit: Payer: Self-pay | Admitting: Family Medicine

## 2022-05-25 ENCOUNTER — Encounter: Payer: Self-pay | Admitting: Family Medicine

## 2022-05-25 ENCOUNTER — Telehealth: Payer: Self-pay

## 2022-05-25 ENCOUNTER — Ambulatory Visit (INDEPENDENT_AMBULATORY_CARE_PROVIDER_SITE_OTHER): Payer: PPO | Admitting: Family Medicine

## 2022-05-25 VITALS — BP 137/77 | HR 63 | Temp 98.1°F | Ht 62.0 in | Wt 128.8 lb

## 2022-05-25 DIAGNOSIS — K580 Irritable bowel syndrome with diarrhea: Secondary | ICD-10-CM

## 2022-05-25 DIAGNOSIS — K219 Gastro-esophageal reflux disease without esophagitis: Secondary | ICD-10-CM | POA: Diagnosis not present

## 2022-05-25 MED ORDER — PANTOPRAZOLE SODIUM 40 MG PO TBEC: 40.0000 mg | DELAYED_RELEASE_TABLET | Freq: Every day | ORAL | 1 refills | Status: DC

## 2022-05-25 NOTE — Telephone Encounter (Signed)
Pt was seen in office today, confirmed completion of cologuard kit and sent back last week. We should receive results in the next 2 weeks.

## 2022-05-25 NOTE — Progress Notes (Signed)
OFFICE VISIT  05/25/2022  CC:  Chief Complaint  Patient presents with   Medical Management of Chronic Issues    F/u GERD, pt states she has had a lot of improvement with use of protonix.    Patient is a 77 y.o. female who presents for 1 month follow-up GERD. A/P as of last visit: "#1 hypertension, well-controlled on bisoprolol/HCTZ 5/6.25, 2 tabs in the morning and 1 in the evening. Electrolytes and creatinine today.   2.  Hyperlipidemia.  Statin has been recommended in the past. Fasting lipid panel today.   3.  Impaired fasting glucose. Fasting glucose and hemoglobin A1c today.   4.  IBS with diarrhea. Reassured.  Okay to use Imodium as needed.   5.  Gastroesophageal reflux disease: Mostly "silent". Start pantoprazole 40 mg daily and recheck in 1 month.   #6 halitosis. See #5 above."  INTERIM HX:  She is doing much much better.  She has adjusted her diet and takes pantoprazole daily.  No further reflux episodes.  She still has a significant amount of gas when she eats, feels bloated.  She does note certain foods such as broccoli and sauerkraut make her symptoms much worse.  She has found herself not eating much at all lately because of the symptoms she often feels. She has no diarrhea but did have this before getting on pantoprazole.  Now she feels like the pantoprazole has made her feel a bit constipated.  No nausea or vomiting.  No abdominal pain.  No fever.  No abnormal weight loss.  No melena or hematochezia.   Past Medical History:  Diagnosis Date   Allergic rhinitis    Fall>spring   COVID-19 virus infection 08/26/2020   Headache(784.0)    Hyperlipemia, mixed 08/2018   Statin recommended 08/2018.   Hypertension    IFG (impaired fasting glucose)    HbA1c 5.3%-5.4% 2016-2018; 5.6% 2021.   Kidney stones    x 1 episode   Left rib fracture 2017   Migraines    when "younger"   Osteoarthritis    Hands, feet, knees: takes meloxicam occas   Osteopenia 06/2019   T  score -2.0.  Plan rpt 06/2021.   Right shoulder injury 12/2014   R AC joint arthritis--steroid injection by ortho/sportsmed MD helped.  Pt also has full thickness RC tear on right.    Past Surgical History:  Procedure Laterality Date   COLONOSCOPY  08/02/06   DEXA  06/2019   T score -2.0. Rpt 2 yrs    Outpatient Medications Prior to Visit  Medication Sig Dispense Refill   bisoprolol-hydrochlorothiazide (ZIAC) 5-6.25 MG tablet TAKE 2 TABLET EVERY MORNING AND 1 TABLET EVERY EVENING 270 tablet 1   Cetirizine HCl (ZYRTEC ALLERGY PO) Take by mouth.     cyclobenzaprine (FLEXERIL) 10 MG tablet Take 1 tablet (10 mg total) by mouth 3 (three) times daily as needed for muscle spasms. 90 tablet 1   diphenhydrAMINE (BENADRYL) 25 MG tablet Take 25 mg by mouth every 6 (six) hours as needed.     meloxicam (MOBIC) 7.5 MG tablet TAKE 1 TABLET (7.5 MG TOTAL) BY MOUTH DAILY AS NEEDED. FOR PAIN 30 tablet 1   Multiple Vitamins-Minerals (WOMENS MULTIVITAMIN PO) Take by mouth.     Omega-3 Fatty Acids (OMEGA 3 PO) Take 1 tablet by mouth.     pantoprazole (PROTONIX) 40 MG tablet Take 1 tablet (40 mg total) by mouth daily. 30 tablet 1   No facility-administered medications prior to visit.  Allergies  Allergen Reactions   Penicillins    Sulfonamide Derivatives     Review of Systems As per HPI  PE:    05/25/2022   10:34 AM 04/27/2022   10:38 AM 11/19/2021    4:06 PM  Vitals with BMI  Height     Weight 128 lbs 13 oz 127 lbs 3 oz 128 lbs 6 oz  BMI 23.55 23.26 23.48  Systolic 137 143 161  Diastolic 77 72 71  Pulse 63 68 66     Physical Exam  Gen: Alert, well appearing.  Patient is oriented to person, place, time, and situation. No further exam today  LABS:  Last CBC Lab Results  Component Value Date   WBC 8.7 02/01/2020   HGB 14.0 02/01/2020   HCT 41.0 02/01/2020   MCV 81.6 02/01/2020   MCH 28.2 08/26/2018   RDW 14.1 02/01/2020   PLT 186.0 02/01/2020   Last metabolic  panel Lab Results  Component Value Date   GLUCOSE 123 (H) 04/27/2022   NA 139 04/27/2022   K 3.7 04/27/2022   CL 97 04/27/2022   CO2 29 04/27/2022   BUN 20 04/27/2022   CREATININE 0.81 04/27/2022   CALCIUM 10.1 04/27/2022   PROT 7.1 10/13/2021   ALBUMIN 4.3 10/13/2021   BILITOT 1.1 10/13/2021   ALKPHOS 84 10/13/2021   AST 21 10/13/2021   ALT 18 10/13/2021   Last lipids Lab Results  Component Value Date   CHOL 214 (H) 04/27/2022   HDL 55.70 04/27/2022   LDLCALC 124 (H) 04/27/2022   LDLDIRECT 100.0 07/30/2020   TRIG 173.0 (H) 04/27/2022   CHOLHDL 4 04/27/2022   Last hemoglobin A1c Lab Results  Component Value Date   HGBA1C 5.6 04/27/2022   IMPRESSION AND PLAN:  #1 GERD: Much improved on daily pantoprazole 40 mg. We discussed continuing this for at least 3 to 6 months and then going to every other day or as needed use.  2.  IBS.  Historically has had diarrhea with this.  Now just bloating since being on pantoprazole. We discussed low FODMAP diet today.  It does appear she eats some high FODMAP foods. She will adjust and see how her symptoms go. Handout given.  #3 mild hypercholesterolemia: LDL 124 a month ago. We will hold off statin at this time. Repeat in 6 months and discuss again at that time.  #4 impaired fasting glucose.  Recent fasting glucose was 123.  Hemoglobin A1c at that time was 5.6%. Repeat in 6 months.  An After Visit Summary was printed and given to the patient.  FOLLOW UP: Return in about 6 months (around 11/24/2022) for annual CPE (fasting).  Signed:  Santiago Bumpers, MD           05/25/2022

## 2022-05-25 NOTE — Patient Instructions (Signed)
Low-FODMAP Eating Plan  FODMAP stands for fermentable oligosaccharides, disaccharides, monosaccharides, and polyols. These are sugars that are hard for some people to digest. A low-FODMAP eating plan may help some people who have irritable bowel syndrome (IBS) and certain other bowel (intestinal) diseases to manage their symptoms. This meal plan can be complicated to follow. Work with a diet and nutrition specialist (dietitian) to make a low-FODMAP eating plan that is right for you. A dietitian can help make sure that you get enough nutrition from this diet. What are tips for following this plan? Reading food labels Check labels for hidden FODMAPs such as: High-fructose syrup. Honey. Agave. Natural fruit flavors. Onion or garlic powder. Choose low-FODMAP foods that contain 3-4 grams of fiber per serving. Check food labels for serving sizes. Eat only one serving at a time to make sure FODMAP levels stay low. Shopping Shop with a list of foods that are recommended on this diet and make a meal plan. Meal planning Follow a low-FODMAP eating plan for up to 6 weeks, or as told by your health care provider or dietitian. To follow the eating plan: Eliminate high-FODMAP foods from your diet completely. Choose only low-FODMAP foods to eat. You will do this for 2-6 weeks. Gradually reintroduce high-FODMAP foods into your diet one at a time. Most people should wait a few days before introducing the next new high-FODMAP food into their meal plan. Your dietitian can recommend how quickly you may reintroduce foods. Keep a daily record of what and how much you eat and drink. Make note of any symptoms that you have after eating. Review your daily record with a dietitian regularly to identify which foods you can eat and which foods you should avoid. General tips Drink enough fluid each day to keep your urine pale yellow. Avoid processed foods. These often have added sugar and may be high in FODMAPs. Avoid  most dairy products, whole grains, and sweeteners. Work with a dietitian to make sure you get enough fiber in your diet. Avoid high FODMAP foods at meals to manage symptoms. Recommended foods Fruits Bananas, oranges, tangerines, lemons, limes, blueberries, raspberries, strawberries, grapes, cantaloupe, honeydew melon, kiwi, papaya, passion fruit, and pineapple. Limited amounts of dried cranberries, banana chips, and shredded coconut. Vegetables Eggplant, zucchini, cucumber, peppers, green beans, bean sprouts, lettuce, arugula, kale, Swiss chard, spinach, collard greens, bok choy, summer squash, potato, and tomato. Limited amounts of corn, carrot, and sweet potato. Green parts of scallions. Grains Gluten-free grains, such as rice, oats, buckwheat, quinoa, corn, polenta, and millet. Gluten-free pasta, bread, or cereal. Rice noodles. Corn tortillas. Meats and other proteins Unseasoned beef, pork, poultry, or fish. Eggs. Bacon. Tofu (firm) and tempeh. Limited amounts of nuts and seeds, such as almonds, walnuts, brazil nuts, pecans, peanuts, nut butters, pumpkin seeds, chia seeds, and sunflower seeds. Dairy Lactose-free milk, yogurt, and kefir. Lactose-free cottage cheese and ice cream. Non-dairy milks, such as almond, coconut, hemp, and rice milk. Non-dairy yogurt. Limited amounts of goat cheese, brie, mozzarella, parmesan, swiss, and other hard cheeses. Fats and oils Butter-free spreads. Vegetable oils, such as olive, canola, and sunflower oil. Seasoning and other foods Artificial sweeteners with names that do not end in "ol," such as aspartame, saccharine, and stevia. Maple syrup, white table sugar, raw sugar, brown sugar, and molasses. Mayonnaise, soy sauce, and tamari. Fresh basil, coriander, parsley, rosemary, and thyme. Beverages Water and mineral water. Sugar-sweetened soft drinks. Small amounts of orange juice or cranberry juice. Black and green tea. Most dry wines.   Coffee. The items listed  above may not be a complete list of foods and beverages you can eat. Contact a dietitian for more information. Foods to avoid Fruits Fresh, dried, and juiced forms of apple, pear, watermelon, peach, plum, cherries, apricots, blackberries, boysenberries, figs, nectarines, and mango. Avocado. Vegetables Chicory root, artichoke, asparagus, cabbage, snow peas, Brussels sprouts, broccoli, sugar snap peas, mushrooms, celery, and cauliflower. Onions, garlic, leeks, and the white part of scallions. Grains Wheat, including kamut, durum, and semolina. Barley and bulgur. Couscous. Wheat-based cereals. Wheat noodles, bread, crackers, and pastries. Meats and other proteins Fried or fatty meat. Sausage. Cashews and pistachios. Soybeans, baked beans, black beans, chickpeas, kidney beans, fava beans, navy beans, lentils, black-eyed peas, and split peas. Dairy Milk, yogurt, ice cream, and soft cheese. Cream and sour cream. Milk-based sauces. Custard. Buttermilk. Soy milk. Seasoning and other foods Any sugar-free gum or candy. Foods that contain artificial sweeteners such as sorbitol, mannitol, isomalt, or xylitol. Foods that contain honey, high-fructose corn syrup, or agave. Bouillon, vegetable stock, beef stock, and chicken stock. Garlic and onion powder. Condiments made with onion, such as hummus, chutney, pickles, relish, salad dressing, and salsa. Tomato paste. Beverages Chicory-based drinks. Coffee substitutes. Chamomile tea. Fennel tea. Sweet or fortified wines such as port or sherry. Diet soft drinks made with isomalt, mannitol, maltitol, sorbitol, or xylitol. Apple, pear, and mango juice. Juices with high-fructose corn syrup. The items listed above may not be a complete list of foods and beverages you should avoid. Contact a dietitian for more information. Summary FODMAP stands for fermentable oligosaccharides, disaccharides, monosaccharides, and polyols. These are sugars that are hard for some people to  digest. A low-FODMAP eating plan is a short-term diet that helps to ease symptoms of certain bowel diseases. The eating plan usually lasts up to 6 weeks. After that, high-FODMAP foods are reintroduced gradually and one at a time. This can help you find out which foods may be causing symptoms. A low-FODMAP eating plan can be complicated. It is best to work with a dietitian who has experience with this type of plan. This information is not intended to replace advice given to you by your health care provider. Make sure you discuss any questions you have with your health care provider. Document Revised: 06/08/2019 Document Reviewed: 06/08/2019 Elsevier Patient Education  2023 Elsevier Inc.  

## 2022-05-28 ENCOUNTER — Telehealth: Payer: Self-pay | Admitting: Family Medicine

## 2022-05-28 DIAGNOSIS — K589 Irritable bowel syndrome without diarrhea: Secondary | ICD-10-CM

## 2022-05-28 NOTE — Telephone Encounter (Signed)
Pt was last seen 4/22, please advise if referral can be completed. Referral pending, no dx attached.

## 2022-05-28 NOTE — Telephone Encounter (Signed)
Okay, referral ordered 

## 2022-05-28 NOTE — Telephone Encounter (Signed)
Patient is asking to be referred to [facility] Dr. Wynelle Beckmann in [place]. She has provided the fax number which is [phone]. She needs a referral since this Dr. Is out of network.

## 2022-06-01 DIAGNOSIS — M2012 Hallux valgus (acquired), left foot: Secondary | ICD-10-CM | POA: Diagnosis not present

## 2022-06-01 DIAGNOSIS — M7741 Metatarsalgia, right foot: Secondary | ICD-10-CM | POA: Diagnosis not present

## 2022-06-01 DIAGNOSIS — M24477 Recurrent dislocation, right toe(s): Secondary | ICD-10-CM | POA: Diagnosis not present

## 2022-06-01 DIAGNOSIS — M2041 Other hammer toe(s) (acquired), right foot: Secondary | ICD-10-CM | POA: Diagnosis not present

## 2022-06-01 DIAGNOSIS — M2011 Hallux valgus (acquired), right foot: Secondary | ICD-10-CM | POA: Diagnosis not present

## 2022-06-09 DIAGNOSIS — M25562 Pain in left knee: Secondary | ICD-10-CM | POA: Diagnosis not present

## 2022-06-10 DIAGNOSIS — M25562 Pain in left knee: Secondary | ICD-10-CM | POA: Diagnosis not present

## 2022-06-23 ENCOUNTER — Other Ambulatory Visit (INDEPENDENT_AMBULATORY_CARE_PROVIDER_SITE_OTHER): Payer: PPO

## 2022-06-23 ENCOUNTER — Ambulatory Visit (INDEPENDENT_AMBULATORY_CARE_PROVIDER_SITE_OTHER): Payer: PPO | Admitting: Sports Medicine

## 2022-06-23 DIAGNOSIS — G8929 Other chronic pain: Secondary | ICD-10-CM | POA: Insufficient documentation

## 2022-06-23 DIAGNOSIS — M1712 Unilateral primary osteoarthritis, left knee: Secondary | ICD-10-CM | POA: Insufficient documentation

## 2022-06-23 MED ORDER — MELOXICAM 15 MG PO TABS
ORAL_TABLET | ORAL | 3 refills | Status: AC
Start: 2022-06-23 — End: ?

## 2022-06-23 NOTE — Assessment & Plan Note (Signed)
Pleasant 77 year old female, known knee pain, was seen at [facility] and had an injection that resulted in more pain and no immediate relief suggesting that the injection ended up and extra-articular. On exam she has an effusion and medial joint line pain, we did an aspiration and injection today, I would like to increase her meloxicam to 15 daily, add physical therapy. Return to see me in 6 weeks. She understands that the next step would be viscosupplementation followed by genicular artery embolization if needed.

## 2022-06-23 NOTE — Progress Notes (Signed)
    Procedures performed today:    Procedure: Real-time Ultrasound Guided aspiration/injection of left knee Device: Samsung HS60  Verbal informed consent obtained.  Time-out conducted.  Noted no overlying erythema, induration, or other signs of local infection.  Skin prepped in a sterile fashion.  Local anesthesia: Topical Ethyl chloride.  With sterile technique and under real time ultrasound guidance: Aspirated 9 mL of clear, straw-colored fluid, syringe switched and 1 cc Kenalog 40, 2 cc lidocaine, 2 cc bupivacaine injected easily Completed without difficulty  Advised to call if fevers/chills, erythema, induration, drainage, or persistent bleeding.  Images permanently stored and available for review in PACS.  Impression: Technically successful ultrasound guided aspiration/injection.  Independent interpretation of notes and tests performed by another provider:   None.  Brief History, Exam, Impression, and Recommendations:    Primary osteoarthritis of left knee Pleasant 77 year old female, known knee pain, was seen at [facility] and had an injection that resulted in more pain and no immediate relief suggesting that the injection ended up and extra-articular. On exam she has an effusion and medial joint line pain, we did an aspiration and injection today, I would like to increase her meloxicam to 15 daily, add physical therapy. Return to see me in 6 weeks. She understands that the next step would be viscosupplementation followed by genicular artery embolization if needed.    ____________________________________________ Ihor Austin. Benjamin Stain, M.D., ABFM., CAQSM., AME. Primary Care and Sports Medicine [facility]  Adjunct Professor of Family Medicine  [employer]  Restaurant manager, fast food

## 2022-06-23 NOTE — Telephone Encounter (Signed)
No results found for pt, will contact [employer] directly for results. [phone]

## 2022-06-24 NOTE — Telephone Encounter (Signed)
Spoke with representative, she mentioned if a pt uses a PO Box they may not receive the kit and it may get send back to them by the post office. Patients should use their home address to receive kits. There should be no issue with sending it back but it depends on the post office location. All patients must use [employer] for sending kits back. Patients can also contact patient support at [phone] to set up shipping with [employer] from their home. Will inform pt to contact patient support for further assistance.

## 2022-06-24 NOTE — Telephone Encounter (Signed)
LVM for pt to return call to discuss

## 2022-06-26 ENCOUNTER — Telehealth: Payer: Self-pay | Admitting: Sports Medicine

## 2022-06-26 NOTE — Telephone Encounter (Signed)
Minimize weightbearing, we can go ahead and get an MRI and add some pain medicine such as tramadol to hold her over in the meantime if okay with her, let me know.    Is she any better at all after the injection?  It often takes several days for the shot to really start kicking in.

## 2022-06-26 NOTE — Telephone Encounter (Signed)
Patient called today to say that although she got a shot in her left knee but she says there is on the inside of her left knee that when she walks or stand it hurts really bad please advise

## 2022-07-06 DIAGNOSIS — Z1211 Encounter for screening for malignant neoplasm of colon: Secondary | ICD-10-CM | POA: Diagnosis not present

## 2022-07-16 LAB — COLOGUARD: COLOGUARD: NEGATIVE

## 2022-07-19 ENCOUNTER — Encounter: Payer: Self-pay | Admitting: Family Medicine

## 2022-07-20 ENCOUNTER — Other Ambulatory Visit: Payer: Self-pay

## 2022-07-20 ENCOUNTER — Ambulatory Visit: Payer: PPO | Attending: Sports Medicine

## 2022-07-20 DIAGNOSIS — M25562 Pain in left knee: Secondary | ICD-10-CM | POA: Diagnosis present

## 2022-07-20 DIAGNOSIS — M1712 Unilateral primary osteoarthritis, left knee: Secondary | ICD-10-CM | POA: Diagnosis not present

## 2022-07-20 NOTE — Therapy (Signed)
OUTPATIENT PHYSICAL THERAPY LOWER EXTREMITY EVALUATION   Patient Name: Sandy Gill MRN: [id number] DOB:[birth year], 77 y.o., female Today's Date: 07/20/2022  END OF SESSION:  PT End of Session - 07/20/22 1040     Visit Number 1    Number of Visits 8    Date for PT Re-Evaluation 08/28/22    PT Start Time 1026    PT Stop Time 1115    PT Time Calculation (min) 49 min    Activity Tolerance Patient tolerated treatment well    Behavior During Therapy [facility] for tasks assessed/performed             Past Medical History:  Diagnosis Date   Allergic rhinitis    Fall>spring   Colon cancer screening    06/2022 cologuard neg   COVID-19 virus infection 08/26/2020   Headache(784.0)    Hyperlipemia, mixed 08/2018   Statin recommended 08/2018.   Hypertension    IFG (impaired fasting glucose)    HbA1c 5.3%-5.4% 2016-2018; 5.6% 2021.   Kidney stones    x 1 episode   Left rib fracture 2017   Migraines    when "younger"   Osteoarthritis    Hands, feet, knees: takes meloxicam occas   Osteopenia 06/2019   T score -2.0.  Plan rpt 06/2021.   Right shoulder injury 12/2014   R AC joint arthritis--steroid injection by ortho/sportsmed MD helped.  Pt also has full thickness RC tear on right.   Past Surgical History:  Procedure Laterality Date   COLONOSCOPY  08/02/06   DEXA  06/2019   T score -2.0. Rpt 2 yrs   Patient Active Problem List   Diagnosis Date Noted   Primary osteoarthritis of left knee 06/23/2022   Senile nuclear sclerosis 11/13/2016   Right shoulder pain 03/29/2015   Stress 02/29/2012   RHINITIS 12/24/2009   HYPERCHOLESTEROLEMIA, MILD 10/30/2008   OSTEOPENIA 02/27/2008   HEADACHE 02/27/2008   MIGRAINE HEADACHE 11/25/2007   INSOMNIA, PERSISTENT 10/05/2006   Essential hypertension 10/05/2006   PALPITATIONS 10/05/2006   REFERRING PROVIDER: Monica Becton, MD   REFERRING DIAG: Primary osteoarthritis of left knee   THERAPY DIAG:  Acute pain of left  knee  Rationale for Evaluation and Treatment: Rehabilitation  ONSET DATE: about 1 month  SUBJECTIVE:   SUBJECTIVE STATEMENT: Patient reports that her left knee has been bothering her for about a month after squatting when she felt something pop in her knee. She had tried an injection of cortisone which did not help any initially, but the last injection really helped. She was told that she has arthritis and a small tear in her meniscus. Her pain has been slowly getting better as it is no longer constant. She had never had any pain like this happen before in her life. She has felt unsteady since this happened especially while on uneven terrain.   PERTINENT HISTORY: Hypertension, osteopenia, osteoarthritis, and history of COVID-19 PAIN:  Are you having pain? Yes: NPRS scale: 5-6/10 Pain location: left knee Pain description: intermittent soreness with rare stabbing behind her kneecap   Aggravating factors: standing and walking (up to 1 hour), lifting, stairs, walking on gravel, and carrying Relieving factors: heat  PRECAUTIONS: None  WEIGHT BEARING RESTRICTIONS: No  FALLS:  Has patient fallen in last 6 months? No  LIVING ENVIRONMENT: Lives with: lives with their family Lives in: House/apartment Stairs: Yes: External: 2 steps; none; step to pattern with right foot leading Has following equipment at home: None  OCCUPATION: part time; health  insurance sales  PLOF: Independent  PATIENT GOALS: ride her stationary bike, reduce pain, and be more steady while walking outside    NEXT MD VISIT: 08/04/22  OBJECTIVE:   PATIENT SURVEYS:  FOTO 56.21  COGNITION: Overall cognitive status: Within functional limits for tasks assessed     SENSATION: Patient reports no numbness or tingling  EDEMA:  No edema observed  PALPATION: TTP: left hip adductors, MCL, LCL, and medial joint line  LOWER EXTREMITY ROM:  Active ROM Right eval Left eval  Hip flexion    Hip extension    Hip  abduction    Hip adduction    Hip internal rotation    Hip external rotation    Knee flexion 129 128  Knee extension 0 3  Ankle dorsiflexion    Ankle plantarflexion    Ankle inversion    Ankle eversion     (Blank rows = not tested)  LOWER EXTREMITY MMT:  MMT Right eval Left eval  Hip flexion 4-/5 4-/5  Hip extension    Hip abduction    Hip adduction    Hip internal rotation    Hip external rotation    Knee flexion 4+/5 4/5; familiar pain  Knee extension 4/5 4-/5  Ankle dorsiflexion 3+/5 3+/5  Ankle plantarflexion    Ankle inversion    Ankle eversion     (Blank rows = not tested)  LOWER EXTREMITY SPECIAL TESTS:  Knee special tests: Anterior drawer test: negative, Posterior drawer test: negative, McMurray's test: negative, and MCL testing: familiar pain, but no increased laxity, and LCL testing: painful, but no increase in laxity  GAIT: Assistive device utilized: None Level of assistance: Complete Independence Comments: no significant gait deviation observed   TODAY'S TREATMENT:                                                                                                                              DATE:     PATIENT EDUCATION:  Education details: POC, healing, prognosis, referred pain, anatomy, riding her stationary bike, exercise progression, and goals for therapy Person educated: Patient Education method: Explanation Education comprehension: verbalized understanding  HOME EXERCISE PROGRAM:   ASSESSMENT:  CLINICAL IMPRESSION: Patient is a 77 y.o. female who was seen today for physical therapy evaluation and treatment for acute left knee pain. She presented with moderate pain severity and irritability with palpation to her left knee musculature being the most aggravating to her familiar symptoms. She also exhibited reduced left quadriceps and hamstring strength compared to the right knee. Recommend that she continue with skilled physical therapy to address her  impairments to return to her prior level of function.  OBJECTIVE IMPAIRMENTS: decreased activity tolerance, decreased mobility, difficulty walking, decreased strength, and pain.   ACTIVITY LIMITATIONS: carrying, lifting, standing, squatting, stairs, and locomotion level  PARTICIPATION LIMITATIONS: meal prep, cleaning, laundry, shopping, community activity, and yard work  PERSONAL FACTORS: 3+ comorbidities: Hypertension, osteopenia, osteoarthritis, and history of COVID-19  are also affecting patient's functional outcome.   REHAB POTENTIAL: Good  CLINICAL DECISION MAKING: Stable/uncomplicated  EVALUATION COMPLEXITY: Low   GOALS: Goals reviewed with patient? Yes  LONG TERM GOALS: Target date: 08/17/22  Patient will be independent with her HEP. Baseline:  Goal status: INITIAL  2.  Patient will be able to complete her daily activities without her familiar pain exceeding 3/10. Baseline:  Goal status: INITIAL  3.  Patient will be able to navigate at least 3 steps with a reciprocal pattern for improved function navigating her household environment. Baseline:  Goal status: INITIAL  4.  Patient will be able to squat and lift at least 8 pounds for improved function lifting her groceries. Baseline:  Goal status: INITIAL  PLAN:  PT FREQUENCY: 2x/week  PT DURATION: 4 weeks  PLANNED INTERVENTIONS: Therapeutic exercises, Therapeutic activity, Neuromuscular re-education, Balance training, Patient/Family education, Self Care, Joint mobilization, Stair training, Electrical stimulation, Cryotherapy, Moist heat, Vasopneumatic device, Manual therapy, and Re-evaluation  PLAN FOR NEXT SESSION: nustep/recumbent bike, lower extremity strengthening, and balance interventions   Granville Lewis, PT 07/20/2022, 5:41 PM

## 2022-07-23 ENCOUNTER — Ambulatory Visit: Payer: PPO

## 2022-07-23 DIAGNOSIS — M25562 Pain in left knee: Secondary | ICD-10-CM

## 2022-07-23 NOTE — Therapy (Signed)
OUTPATIENT PHYSICAL THERAPY LOWER EXTREMITY TREATMENT   Patient Name: Sandy Gill MRN: [id number] DOB:Oct 07, 1945, 77 y.o., female Today's Date: 07/23/2022  END OF SESSION:  PT End of Session - 07/23/22 1005     Visit Number 2    Number of Visits 8    Date for PT Re-Evaluation 08/28/22    PT Start Time 0941   Patient arrived late to her appointment.   PT Stop Time 1017    PT Time Calculation (min) 36 min    Activity Tolerance Patient tolerated treatment well    Behavior During Therapy [facility] for tasks assessed/performed              Past Medical History:  Diagnosis Date   Allergic rhinitis    Fall>spring   Colon cancer screening    06/2022 cologuard neg   COVID-19 virus infection 08/26/2020   Headache(784.0)    Hyperlipemia, mixed 08/2018   Statin recommended 08/2018.   Hypertension    IFG (impaired fasting glucose)    HbA1c 5.3%-5.4% 2016-2018; 5.6% 2021.   Kidney stones    x 1 episode   Left rib fracture 2017   Migraines    when "younger"   Osteoarthritis    Hands, feet, knees: takes meloxicam occas   Osteopenia 06/2019   T score -2.0.  Plan rpt 06/2021.   Right shoulder injury 12/2014   R AC joint arthritis--steroid injection by ortho/sportsmed MD helped.  Pt also has full thickness RC tear on right.   Past Surgical History:  Procedure Laterality Date   COLONOSCOPY  08/02/06   DEXA  06/2019   T score -2.0. Rpt 2 yrs   Patient Active Problem List   Diagnosis Date Noted   Primary osteoarthritis of left knee 06/23/2022   Senile nuclear sclerosis 11/13/2016   Right shoulder pain 03/29/2015   Stress 02/29/2012   RHINITIS 12/24/2009   HYPERCHOLESTEROLEMIA, MILD 10/30/2008   OSTEOPENIA 02/27/2008   HEADACHE 02/27/2008   MIGRAINE HEADACHE 11/25/2007   INSOMNIA, PERSISTENT 10/05/2006   Essential hypertension 10/05/2006   PALPITATIONS 10/05/2006   REFERRING PROVIDER: Monica Becton, MD   REFERRING DIAG: Primary osteoarthritis of left knee    THERAPY DIAG:  Acute pain of left knee  Rationale for Evaluation and Treatment: Rehabilitation  ONSET DATE: about 1 month  SUBJECTIVE:   SUBJECTIVE STATEMENT: Patient reports that her knee feels fine today. However, she had a sharp pain in the back of her leg yesterday while standing. She is unable to recall a reason why this happened.   PERTINENT HISTORY: Hypertension, osteopenia, osteoarthritis, and history of COVID-19 PAIN:  Are you having pain? Yes: NPRS scale: 0/10 Pain location: left knee Pain description: intermittent soreness with rare stabbing behind her kneecap   Aggravating factors: standing and walking (up to 1 hour), lifting, stairs, walking on gravel, and carrying Relieving factors: heat  PRECAUTIONS: None  WEIGHT BEARING RESTRICTIONS: No  FALLS:  Has patient fallen in last 6 months? No  LIVING ENVIRONMENT: Lives with: lives with their family Lives in: House/apartment Stairs: Yes: External: 2 steps; none; step to pattern with right foot leading Has following equipment at home: None  OCCUPATION: part time; health insurance sales  PLOF: Independent  PATIENT GOALS: ride her stationary bike, reduce pain, and be more steady while walking outside    NEXT MD VISIT: 08/04/22  OBJECTIVE: all objective measures were assessed at her initial evaluation on 07/20/22 unless otherwise noted  PATIENT SURVEYS:  FOTO 56.21  COGNITION: Overall cognitive  status: Within functional limits for tasks assessed     SENSATION: Patient reports no numbness or tingling  EDEMA:  No edema observed  PALPATION: TTP: left hip adductors, MCL, LCL, and medial joint line  LOWER EXTREMITY ROM:  Active ROM Right eval Left eval  Hip flexion    Hip extension    Hip abduction    Hip adduction    Hip internal rotation    Hip external rotation    Knee flexion 129 128  Knee extension 0 3  Ankle dorsiflexion    Ankle plantarflexion    Ankle inversion    Ankle eversion      (Blank rows = not tested)  LOWER EXTREMITY MMT:  MMT Right eval Left eval  Hip flexion 4-/5 4-/5  Hip extension    Hip abduction    Hip adduction    Hip internal rotation    Hip external rotation    Knee flexion 4+/5 4/5; familiar pain  Knee extension 4/5 4-/5  Ankle dorsiflexion 3+/5 3+/5  Ankle plantarflexion    Ankle inversion    Ankle eversion     (Blank rows = not tested)  LOWER EXTREMITY SPECIAL TESTS:  Knee special tests: Anterior drawer test: negative, Posterior drawer test: negative, McMurray's test: negative, and MCL testing: familiar pain, but no increased laxity, and LCL testing: painful, but no increase in laxity  GAIT: Assistive device utilized: None Level of assistance: Complete Independence Comments: no significant gait deviation observed   TODAY'S TREATMENT:                                                                                                                              DATE:                                     07/23/22 EXERCISE LOG  Exercise Repetitions and Resistance Comments  Gastroc stretch  4 x 30 seconds   Standing HS stretch  4 x 30 seconds   Nustep  L5 x 12 minutes   LAQ 4# x 3 minutes   Seated HS curl  Green t-band x 2 minutes each     Blank cell = exercise not performed today   PATIENT EDUCATION:  Education details: POC, healing, prognosis, referred pain, anatomy, riding her stationary bike, exercise progression, and goals for therapy Person educated: Patient Education method: Explanation Education comprehension: verbalized understanding  HOME EXERCISE PROGRAM: [email or web address]  ASSESSMENT:  CLINICAL IMPRESSION: Patient was introduced to multiple new interventions for improved soft tissue extensibility and lower extremity strength. She required minimal cueing with gastrocnemius and hamstring stretching to facilitate improved soft tissue extensibility. She experienced the most difficulty with resisted hamstring curls, but she reported  no increase in pain or discomfort. She was provided a home exercise program and she reported feeling comfortable with these interventions. She reported that her knee felt good upon  the conclusion of treatment. She continues to require skilled physical therapy to address her remaining impairments to return to her prior level of function.   OBJECTIVE IMPAIRMENTS: decreased activity tolerance, decreased mobility, difficulty walking, decreased strength, and pain.   ACTIVITY LIMITATIONS: carrying, lifting, standing, squatting, stairs, and locomotion level  PARTICIPATION LIMITATIONS: meal prep, cleaning, laundry, shopping, community activity, and yard work  PERSONAL FACTORS: 3+ comorbidities: Hypertension, osteopenia, osteoarthritis, and history of COVID-19  are also affecting patient's functional outcome.   REHAB POTENTIAL: Good  CLINICAL DECISION MAKING: Stable/uncomplicated  EVALUATION COMPLEXITY: Low   GOALS: Goals reviewed with patient? Yes  LONG TERM GOALS: Target date: 08/17/22  Patient will be independent with her HEP. Baseline:  Goal status: INITIAL  2.  Patient will be able to complete her daily activities without her familiar pain exceeding 3/10. Baseline:  Goal status: INITIAL  3.  Patient will be able to navigate at least 3 steps with a reciprocal pattern for improved function navigating her household environment. Baseline:  Goal status: INITIAL  4.  Patient will be able to squat and lift at least 8 pounds for improved function lifting her groceries. Baseline:  Goal status: INITIAL  PLAN:  PT FREQUENCY: 2x/week  PT DURATION: 4 weeks  PLANNED INTERVENTIONS: Therapeutic exercises, Therapeutic activity, Neuromuscular re-education, Balance training, Patient/Family education, Self Care, Joint mobilization, Stair training, Electrical stimulation, Cryotherapy, Moist heat, Vasopneumatic device, Manual therapy, and Re-evaluation  PLAN FOR NEXT SESSION: nustep/recumbent  bike, lower extremity strengthening, and balance interventions   Granville Lewis, PT 07/23/2022, 12:14 PM

## 2022-07-27 ENCOUNTER — Ambulatory Visit: Payer: PPO | Admitting: Physical Therapy

## 2022-07-29 ENCOUNTER — Encounter: Payer: Self-pay | Admitting: Physical Therapy

## 2022-07-29 ENCOUNTER — Ambulatory Visit: Payer: PPO | Admitting: Physical Therapy

## 2022-07-29 DIAGNOSIS — M25562 Pain in left knee: Secondary | ICD-10-CM | POA: Diagnosis not present

## 2022-07-29 NOTE — Therapy (Addendum)
 OUTPATIENT PHYSICAL THERAPY LOWER EXTREMITY TREATMENT   Patient Name: Sandy Gill MRN: [id number] DOB:1946/01/31, 77 y.o., female Today's Date: 07/29/2022  END OF SESSION:  PT End of Session - 07/29/22 0849     Visit Number 3    Number of Visits 8    Date for PT Re-Evaluation 08/28/22    PT Start Time 0849    PT Stop Time 0933    PT Time Calculation (min) 44 min    Activity Tolerance Patient tolerated treatment well    Behavior During Therapy [facility] for tasks assessed/performed            Past Medical History:  Diagnosis Date   Allergic rhinitis    Fall>spring   Colon cancer screening    06/2022 cologuard neg   COVID-19 virus infection 08/26/2020   Headache(784.0)    Hyperlipemia, mixed 08/2018   Statin recommended 08/2018.   Hypertension    IFG (impaired fasting glucose)    HbA1c 5.3%-5.4% 2016-2018; 5.6% 2021.   Kidney stones    x 1 episode   Left rib fracture 2017   Migraines    when "younger"   Osteoarthritis    Hands, feet, knees: takes meloxicam  occas   Osteopenia 06/2019   T score -2.0.  Plan rpt 06/2021.   Right shoulder injury 12/2014   R AC joint arthritis--steroid injection by ortho/sportsmed MD helped.  Pt also has full thickness RC tear on right.   Past Surgical History:  Procedure Laterality Date   COLONOSCOPY  08/02/06   DEXA  06/2019   T score -2.0. Rpt 2 yrs   Patient Active Problem List   Diagnosis Date Noted   Primary osteoarthritis of left knee 06/23/2022   Senile nuclear sclerosis 11/13/2016   Right shoulder pain 03/29/2015   Stress 02/29/2012   RHINITIS 12/24/2009   HYPERCHOLESTEROLEMIA, MILD 10/30/2008   OSTEOPENIA 02/27/2008   HEADACHE 02/27/2008   MIGRAINE HEADACHE 11/25/2007   INSOMNIA, PERSISTENT 10/05/2006   Essential hypertension 10/05/2006   PALPITATIONS 10/05/2006   REFERRING PROVIDER: Gean Keels, MD   REFERRING DIAG: Primary osteoarthritis of left knee   THERAPY DIAG:  Acute pain of left  knee  Rationale for Evaluation and Treatment: Rehabilitation  ONSET DATE: about 1 month  SUBJECTIVE:   SUBJECTIVE STATEMENT: Reports that she is very sore today but notices soreness more with carrying things such as groceries and stepping down.  PERTINENT HISTORY: Hypertension, osteopenia, osteoarthritis, and history of COVID-19  PAIN:  Are you having pain? Yes: NPRS scale: 5-6/10 Pain location: left knee Pain description: intermittent soreness with rare stabbing behind her kneecap   Aggravating factors: standing and walking (up to 1 hour), lifting, stairs, walking on gravel, and carrying Relieving factors: heat  PRECAUTIONS: None  WEIGHT BEARING RESTRICTIONS: No  FALLS:  Has patient fallen in last 6 months? No  LIVING ENVIRONMENT: Lives with: lives with their family Lives in: House/apartment Stairs: Yes: External: 2 steps; none; step to pattern with right foot leading Has following equipment at home: None  PATIENT GOALS: ride her stationary bike, reduce pain, and be more steady while walking outside    NEXT MD VISIT: 08/04/22  OBJECTIVE: all objective measures were assessed at her initial evaluation on 07/20/22 unless otherwise noted  PATIENT SURVEYS:  FOTO 56.21  PALPATION: TTP: left hip adductors, MCL, LCL, and medial joint line  LOWER EXTREMITY ROM:  Active ROM Right eval Left eval  Hip flexion    Hip extension    Hip abduction  Hip adduction    Hip internal rotation    Hip external rotation    Knee flexion 129 128  Knee extension 0 3  Ankle dorsiflexion    Ankle plantarflexion    Ankle inversion    Ankle eversion     (Blank rows = not tested)  LOWER EXTREMITY MMT:  MMT Right eval Left eval  Hip flexion 4-/5 4-/5  Hip extension    Hip abduction    Hip adduction    Hip internal rotation    Hip external rotation    Knee flexion 4+/5 4/5; familiar pain  Knee extension 4/5 4-/5  Ankle dorsiflexion 3+/5 3+/5  Ankle plantarflexion    Ankle  inversion    Ankle eversion     (Blank rows = not tested)  LOWER EXTREMITY SPECIAL TESTS:  Knee special tests: Anterior drawer test: negative, Posterior drawer test: negative, McMurray's test: negative, and MCL testing: familiar pain, but no increased laxity, and LCL testing: painful, but no increase in laxity  GAIT: Assistive device utilized: None Level of assistance: Complete Independence Comments: no significant gait deviation observed  TODAY'S TREATMENT:                                                                                                                              DATE:    07/29/22 EXERCISE LOG  Exercise Repetitions and Resistance Comments  Standing hip flexion With knee ext x20 reps and toe raise   Standing hip abduction With toe raise x20 reps   Heel/toe raises X20 reps   Nustep  L4, seat 5  x 15 minutes   LAQ 3# x 20 reps   SLR X10 reps   SAQ X20 reps AROM   Seated HS curl  Red theraband x 20 reps    Blank cell = exercise not performed today   PATIENT EDUCATION:  Education details: POC, healing, prognosis, referred pain, anatomy, riding her stationary bike, exercise progression, and goals for therapy Person educated: Patient Education method: Explanation Education comprehension: verbalized understanding  HOME EXERCISE PROGRAM: [id number] [id number] (provided on 07/29/22)  ASSESSMENT:  CLINICAL IMPRESSION: Patient presented in clinic with reports of mod soreness of L knee. Patient very guarded with ambulation and transfers as well as twisting due to knee pain shooting up and knee buckling. Patient had no AD for gait. Patient guided through light quad/hip strengthening exercises with no complaints during therex. Patient did have knee buckling and pain episode in PT after standing from sitting. Patient encouraged to rest at times but provided an HEP. Patient educated on new HEP and encouraged to monitor symptoms and to stop if any pain increased. Patient also advised  to use ice as needed for 15-20 minutes.  PHYSICAL THERAPY DISCHARGE SUMMARY  Visits from Start of Care: 3  Current functional level related to goals / functional outcomes: Patient was unable to meet her goals for skilled physical therapy as she did not complete her recommended plan  of care.    Remaining deficits: Pain   Education / Equipment: HEP    Patient agrees to discharge. Patient goals were not met. Patient is being discharged due to not returning since the last visit.  Glendora Landsman, PT, DPT    OBJECTIVE IMPAIRMENTS: decreased activity tolerance, decreased mobility, difficulty walking, decreased strength, and pain.   ACTIVITY LIMITATIONS: carrying, lifting, standing, squatting, stairs, and locomotion level  PARTICIPATION LIMITATIONS: meal prep, cleaning, laundry, shopping, community activity, and yard work  PERSONAL FACTORS: 3+ comorbidities: Hypertension, osteopenia, osteoarthritis, and history of COVID-19 are also affecting patient's functional outcome.   REHAB POTENTIAL: Good  CLINICAL DECISION MAKING: Stable/uncomplicated  EVALUATION COMPLEXITY: Low   GOALS: Goals reviewed with patient? Yes  LONG TERM GOALS: Target date: 08/17/22  Patient will be independent with her HEP. Baseline:  Goal status: INITIAL  2.  Patient will be able to complete her daily activities without her familiar pain exceeding 3/10. Baseline:  Goal status: INITIAL  3.  Patient will be able to navigate at least 3 steps with a reciprocal pattern for improved function navigating her household environment. Baseline:  Goal status: INITIAL  4.  Patient will be able to squat and lift at least 8 pounds for improved function lifting her groceries. Baseline:  Goal status: INITIAL  PLAN:  PT FREQUENCY: 2x/week  PT DURATION: 4 weeks  PLANNED INTERVENTIONS: Therapeutic exercises, Therapeutic activity, Neuromuscular re-education, Balance training, Patient/Family education, Self Care, Joint  mobilization, Stair training, Electrical stimulation, Cryotherapy, Moist heat, Vasopneumatic device, Manual therapy, and Re-evaluation  PLAN FOR NEXT SESSION: nustep/recumbent bike, lower extremity strengthening, and balance interventions   Gladies Lambing, PTA 07/29/2022, 10:29 AM

## 2022-08-04 ENCOUNTER — Ambulatory Visit (INDEPENDENT_AMBULATORY_CARE_PROVIDER_SITE_OTHER): Payer: PPO

## 2022-08-04 ENCOUNTER — Ambulatory Visit (INDEPENDENT_AMBULATORY_CARE_PROVIDER_SITE_OTHER): Payer: PPO | Admitting: Sports Medicine

## 2022-08-04 DIAGNOSIS — M1712 Unilateral primary osteoarthritis, left knee: Secondary | ICD-10-CM | POA: Diagnosis not present

## 2022-08-04 DIAGNOSIS — M25562 Pain in left knee: Secondary | ICD-10-CM | POA: Diagnosis not present

## 2022-08-04 DIAGNOSIS — S83232A Complex tear of medial meniscus, current injury, left knee, initial encounter: Secondary | ICD-10-CM | POA: Diagnosis not present

## 2022-08-04 DIAGNOSIS — R609 Edema, unspecified: Secondary | ICD-10-CM | POA: Diagnosis not present

## 2022-08-04 DIAGNOSIS — G8929 Other chronic pain: Secondary | ICD-10-CM

## 2022-08-04 DIAGNOSIS — M25462 Effusion, left knee: Secondary | ICD-10-CM | POA: Diagnosis not present

## 2022-08-04 MED ORDER — HYDROCODONE-ACETAMINOPHEN 5-325 MG PO TABS: 1.0000 | ORAL_TABLET | Freq: Three times a day (TID) | ORAL | 0 refills | Status: DC | PRN

## 2022-08-04 NOTE — Assessment & Plan Note (Signed)
This pleasant 77 year old female returns, she has known chronic knee pain, she was seen at [facility], she had x-rays that showed some degenerative changes, she had an injection that resulted in more pain and no relief, this was suggested that the injection ended up extra-articular, I last saw her May 21, we did an aspiration and injection, we increased her meloxicam to 15 mg daily and added physical therapy. Unfortunately she continues to have significant discomfort. Due to persistent pain, medial joint line, effusions with mechanical symptoms we are going to proceed with an MRI. If we see mostly meniscal tearing with minimal arthritis we will refer for arthroscopy, she does understand that if there is a significant amount of arthritis with meniscal tearing we would need to consider viscosupplementation potentially followed by arthroplasty. I would like to give her some hydrocodone for pain in the meantime.

## 2022-08-04 NOTE — Progress Notes (Signed)
    Procedures performed today:    None.  Independent interpretation of notes and tests performed by another provider:   None.  Brief History, Exam, Impression, and Recommendations:    Chronic pain of left knee This pleasant 77 year old female returns, she has known chronic knee pain, she was seen at [facility], she had x-rays that showed some degenerative changes, she had an injection that resulted in more pain and no relief, this was suggested that the injection ended up extra-articular, I last saw her May 21, we did an aspiration and injection, we increased her meloxicam to 15 mg daily and added physical therapy. Unfortunately she continues to have significant discomfort. Due to persistent pain, medial joint line, effusions with mechanical symptoms we are going to proceed with an MRI. If we see mostly meniscal tearing with minimal arthritis we will refer for arthroscopy, she does understand that if there is a significant amount of arthritis with meniscal tearing we would need to consider viscosupplementation potentially followed by arthroplasty. I would like to give her some hydrocodone for pain in the meantime.    ____________________________________________ Ihor [place]. Benjamin Stain, M.D., ABFM., CAQSM., AME. Primary Care and Sports Medicine [facility]  Adjunct Professor of Family Medicine  [employer]  Restaurant manager, fast food

## 2022-08-05 ENCOUNTER — Ambulatory Visit: Payer: PPO | Admitting: Physical Therapy

## 2022-08-05 ENCOUNTER — Telehealth: Payer: Self-pay | Admitting: Sports Medicine

## 2022-08-05 NOTE — Telephone Encounter (Signed)
PA information submitted via [email or web address] for [employer] Paperwork has been printed and given to Dr. T for signatures. Once obtained, information will be faxed to [employer] at [phone]  

## 2022-08-05 NOTE — Telephone Encounter (Signed)
[employer] approval please left knee.  XR and mri confirmed OA, failed PT and injections.

## 2022-08-13 NOTE — Telephone Encounter (Signed)
Patient approved and understands her financial responsibility and will call back and get scheduled.

## 2022-10-09 ENCOUNTER — Other Ambulatory Visit: Payer: Self-pay | Admitting: Family Medicine

## 2022-11-05 ENCOUNTER — Other Ambulatory Visit: Payer: Self-pay | Admitting: Sports Medicine

## 2022-11-05 DIAGNOSIS — M1712 Unilateral primary osteoarthritis, left knee: Secondary | ICD-10-CM

## 2022-12-01 ENCOUNTER — Ambulatory Visit (INDEPENDENT_AMBULATORY_CARE_PROVIDER_SITE_OTHER): Payer: PPO | Admitting: Urgent Care

## 2022-12-01 ENCOUNTER — Encounter: Payer: Self-pay | Admitting: Urgent Care

## 2022-12-01 VITALS — BP 123/68 | HR 61 | Temp 98.1°F | Wt 127.4 lb

## 2022-12-01 DIAGNOSIS — L03032 Cellulitis of left toe: Secondary | ICD-10-CM | POA: Insufficient documentation

## 2022-12-01 DIAGNOSIS — R109 Unspecified abdominal pain: Secondary | ICD-10-CM | POA: Diagnosis not present

## 2022-12-01 DIAGNOSIS — Z23 Encounter for immunization: Secondary | ICD-10-CM | POA: Diagnosis not present

## 2022-12-01 DIAGNOSIS — R140 Abdominal distension (gaseous): Secondary | ICD-10-CM

## 2022-12-01 DIAGNOSIS — L84 Corns and callosities: Secondary | ICD-10-CM | POA: Diagnosis not present

## 2022-12-01 MED ORDER — CEPHALEXIN 500 MG PO CAPS
500.0000 mg | ORAL_CAPSULE | Freq: Four times a day (QID) | ORAL | 0 refills | Status: AC
Start: 2022-12-01 — End: 2022-12-06

## 2022-12-01 MED ORDER — UREA 41 % EX CREA
1.0000 | TOPICAL_CREAM | Freq: Every day | CUTANEOUS | 0 refills | Status: DC
Start: 2022-12-01 — End: 2023-06-30

## 2022-12-01 MED ORDER — PANTOPRAZOLE SODIUM 40 MG PO TBEC
40.0000 mg | DELAYED_RELEASE_TABLET | Freq: Every day | ORAL | 1 refills | Status: DC
Start: 2022-12-01 — End: 2023-07-09

## 2022-12-01 NOTE — Assessment & Plan Note (Signed)
Self-managed with scissors and a rotary tool, leading to bleeding episodes. Recent onset of pain and warmth, raising concern for infection. Possible plantar wart. -Start oral antibiotic (Cephalexin) for infection. -Apply Urea cream 40% to callus areas to safely reduce thickened skin. -Soak toe in Epsom salt bath for pain relief. -Consider cryotherapy if no improvement in 1.5 weeks.

## 2022-12-01 NOTE — Assessment & Plan Note (Addendum)
-  Apply Urea cream 40% to callus areas to safely reduce thickened skin.

## 2022-12-01 NOTE — Progress Notes (Signed)
Established Patient Office Visit  Subjective:  Patient ID: Sandy Gill, female    DOB: [birth year]  Age: 77 y.o. MRN: [id number]  Chief Complaint  Patient presents with   Toe Injury    Left big toe pain. She states she was trimming her callus and cut her toe.    Pleasant 77yo presents with a chief complaint of a painful, bleeding big toe on the L. She has a history of calluses on her toes, which she has been managing by trimming with scissors and filing with a device. However, about five to six months ago, she noticed that one of her toes started bleeding after trimming a callus. The bleeding was profuse and took about two hours to stop, but the patient did not experience any pain. The same incident occurred again six weeks ago. The patient noticed that her toe started hurting yesterday and felt warm to touch, suggestive of a possible infection. She also reported the toe "feeling feverish". The patient denies any history of trauma or walking barefoot. She also mentioned having sharp pains in her toe in the past few weeks. The patient has a history of a plantar wart on her foot years ago, which caused deep pain. She also has a similar callus on her other foot, but it has not caused any bleeding or pain. The patient has not used any topical therapies for her calluses before. She has a known allergy to penicillin and sulfa, with the reaction to penicillin being a mild rash.  Pt additionally reports needing a refill of her pantoprazole. She states she has been gassy and bloated. She feels mild abdominal distention. Pt also reports heartburn/ reflux sx, worse at night. This has been going on for a while. There are certain foods such as cabbage and broccoli she can no longer eat due to the bloating sensation that ensues. She was started on pantoprazole a while back which has helped with the esophageal burning and regurgitation, but the bloating and gas persists. She also reports fetid  breath.    Patient Active Problem List   Diagnosis Date Noted   Cellulitis of great toe of left foot 12/01/2022   Corn or callus 12/01/2022   Abdominal bloating with cramps 12/01/2022   Chronic pain of left knee 06/23/2022   Senile nuclear sclerosis 11/13/2016   Right shoulder pain 03/29/2015   Stress 02/29/2012   RHINITIS 12/24/2009   HYPERCHOLESTEROLEMIA, MILD 10/30/2008   OSTEOPENIA 02/27/2008   HEADACHE 02/27/2008   MIGRAINE HEADACHE 11/25/2007   INSOMNIA, PERSISTENT 10/05/2006   Essential hypertension 10/05/2006   PALPITATIONS 10/05/2006   Past Medical History:  Diagnosis Date   Allergic rhinitis    Fall>spring   Colon cancer screening    06/2022 cologuard neg   COVID-19 virus infection 08/26/2020   Headache(784.0)    Hyperlipemia, mixed 08/2018   Statin recommended 08/2018.   Hypertension    IFG (impaired fasting glucose)    HbA1c 5.3%-5.4% 2016-2018; 5.6% 2021.   Kidney stones    x 1 episode   Left rib fracture 2017   Migraines    when "younger"   Osteoarthritis    Hands, feet, knees: takes meloxicam occas   Osteopenia 06/2019   T score -2.0.  Plan rpt 06/2021.   Right shoulder injury 12/2014   R AC joint arthritis--steroid injection by ortho/sportsmed MD helped.  Pt also has full thickness RC tear on right.   Social History   Tobacco Use   Smoking status: Never  Smokeless tobacco: Never  Substance Use Topics   Alcohol use: No   Drug use: No      ROS: as noted in HPI  Objective:     BP 123/68   Pulse 61   Temp 98.1 F (36.7 C) (Oral)   Wt 127 lb 6.4 oz (57.8 kg)   SpO2 97%   BMI 23.30 kg/m  BP Readings from Last 3 Encounters:  12/01/22 123/68  05/25/22 137/77  04/27/22 (!) 143/72   Wt Readings from Last 3 Encounters:  12/01/22 127 lb 6.4 oz (57.8 kg)  05/25/22 128 lb 12.8 oz (58.4 kg)  04/27/22 127 lb 3.2 oz (57.7 kg)      Physical Exam Vitals and nursing note reviewed.  Constitutional:      General: She is not in acute  distress.    Appearance: Normal appearance. She is normal weight. She is not ill-appearing, toxic-appearing or diaphoretic.  HENT:     Head: Normocephalic and atraumatic.  Cardiovascular:     Rate and Rhythm: Normal rate.  Pulmonary:     Effort: Pulmonary effort is normal. No respiratory distress.  Abdominal:     General: There is distension (minimal).     Palpations: Abdomen is soft.     Tenderness: There is no abdominal tenderness.  Musculoskeletal:     Right lower leg: No edema.     Left lower leg: No edema.       Feet:  Skin:    General: Skin is warm and dry.     Capillary Refill: Capillary refill takes less than 2 seconds.     Coloration: Skin is not jaundiced.     Findings: Erythema (redness to plantar aspect of L great toe) and lesion (corns to bilateral great toes) present. No bruising.  Neurological:     General: No focal deficit present.     Mental Status: She is alert and oriented to person, place, and time.     Sensory: No sensory deficit.     Motor: No weakness.  Psychiatric:        Mood and Affect: Mood normal.        Behavior: Behavior normal.      No results found for any visits on 12/01/22.  Last CBC Lab Results  Component Value Date   WBC 8.7 02/01/2020   HGB 14.0 02/01/2020   HCT 41.0 02/01/2020   MCV 81.6 02/01/2020   MCH 28.2 08/26/2018   RDW 14.1 02/01/2020   PLT 186.0 02/01/2020   Last metabolic panel Lab Results  Component Value Date   GLUCOSE 123 (H) 04/27/2022   NA 139 04/27/2022   K 3.7 04/27/2022   CL 97 04/27/2022   CO2 29 04/27/2022   BUN 20 04/27/2022   CREATININE 0.81 04/27/2022   GFR 70.87 04/27/2022   CALCIUM 10.1 04/27/2022   PROT 7.1 10/13/2021   ALBUMIN 4.3 10/13/2021   BILITOT 1.1 10/13/2021   ALKPHOS 84 10/13/2021   AST 21 10/13/2021   ALT 18 10/13/2021      The 10-year ASCVD risk score (Arnett DK, et al., 2019) is: 21.8%  Assessment & Plan:  Cellulitis of great toe of left foot Assessment &  Plan: Self-managed with scissors and a rotary tool, leading to bleeding episodes. Recent onset of pain and warmth, raising concern for infection. Possible plantar wart. -Start oral antibiotic (Cephalexin) for infection. -Soak toe in Epsom salt bath for pain relief. -Consider cryotherapy if no improvement in 1.5 weeks.  Orders: -  Cephalexin; Take 1 capsule (500 mg total) by mouth 4 (four) times daily for 5 days.  Dispense: 20 capsule; Refill: 0  **Discussed possible cross reactivity between PCN and cephs. Pt believes she has taken cephalexin in the past without adverse reactions. She reports side effect to PCN manifesting as mild rash; no lip swelling or anaphylaxis. Sx of allergy reviewed.  Corn or callus Assessment & Plan: -Apply Urea cream 40% to callus areas to safely reduce thickened skin.  Orders: -     Urea; Apply 1 application  topically daily.  Dispense: 227 g; Refill: 0  Abdominal bloating with cramps -     Pantoprazole Sodium; Take 1 tablet (40 mg total) by mouth daily.  Dispense: 90 tablet; Refill: 1 -     H. pylori antigen, stool; Future  Will start with H pylori workup - to prevent having to stop the PPI and do 2 week washout period, will collect the stool antigen. Should the test be negative, will consider hydrogen breath test to r/o SIBO in the future.  Need for influenza vaccination -     Flu Vaccine Trivalent High Dose (Fluad)     No follow-ups on file.   Maretta Bees, PA

## 2022-12-01 NOTE — Patient Instructions (Signed)
Please soak your left great toe in warm epsom salt water, 2-3x/ day until symptoms resolve. Take the antibiotic four times daily for the next 5 days.  I have sent in Urea Cream - apply this to the corns and calluses as needed.  You may continue taking the pantoprazole. Please complete the stool test as discussed, this will test for a bacteria in your gut. Please freeze the sample after collecting and return to our office as soon as possible.  If the test is positive, we will call you with next steps. If it is negative, I would then recommend the hydrogen breath test to rule out SIBO.  Please follow up with Dr. Benjamin Stain for your Visco injection as previously discussed.

## 2022-12-03 ENCOUNTER — Other Ambulatory Visit: Payer: PPO

## 2022-12-03 DIAGNOSIS — R140 Abdominal distension (gaseous): Secondary | ICD-10-CM | POA: Diagnosis not present

## 2022-12-03 DIAGNOSIS — R109 Unspecified abdominal pain: Secondary | ICD-10-CM

## 2022-12-05 LAB — H. PYLORI ANTIGEN, STOOL: H pylori Ag, Stl: NEGATIVE

## 2022-12-07 ENCOUNTER — Telehealth: Payer: Self-pay | Admitting: Urgent Care

## 2022-12-07 DIAGNOSIS — R109 Unspecified abdominal pain: Secondary | ICD-10-CM

## 2022-12-07 NOTE — Telephone Encounter (Signed)
Order sent to [facility] for the outpatient Hydrogen breath test to be completed.

## 2023-05-26 ENCOUNTER — Encounter: Payer: PPO | Admitting: Family Medicine

## 2023-06-10 ENCOUNTER — Encounter: Admitting: Family Medicine

## 2023-06-10 DIAGNOSIS — R109 Unspecified abdominal pain: Secondary | ICD-10-CM

## 2023-06-17 ENCOUNTER — Encounter: Admitting: Family Medicine

## 2023-06-30 ENCOUNTER — Encounter: Payer: Self-pay | Admitting: Family Medicine

## 2023-06-30 ENCOUNTER — Ambulatory Visit (INDEPENDENT_AMBULATORY_CARE_PROVIDER_SITE_OTHER): Admitting: Family Medicine

## 2023-06-30 VITALS — BP 136/80 | HR 55 | Temp 98.1°F | Ht 62.5 in | Wt 128.6 lb

## 2023-06-30 DIAGNOSIS — R7301 Impaired fasting glucose: Secondary | ICD-10-CM

## 2023-06-30 DIAGNOSIS — Z1231 Encounter for screening mammogram for malignant neoplasm of breast: Secondary | ICD-10-CM | POA: Diagnosis not present

## 2023-06-30 DIAGNOSIS — E782 Mixed hyperlipidemia: Secondary | ICD-10-CM

## 2023-06-30 DIAGNOSIS — K219 Gastro-esophageal reflux disease without esophagitis: Secondary | ICD-10-CM | POA: Diagnosis not present

## 2023-06-30 DIAGNOSIS — Z0000 Encounter for general adult medical examination without abnormal findings: Secondary | ICD-10-CM | POA: Diagnosis not present

## 2023-06-30 DIAGNOSIS — E2839 Other primary ovarian failure: Secondary | ICD-10-CM | POA: Diagnosis not present

## 2023-06-30 LAB — COMPREHENSIVE METABOLIC PANEL WITH GFR
ALT: 16 U/L (ref 0–35)
AST: 18 U/L (ref 0–37)
Albumin: 4.7 g/dL (ref 3.5–5.2)
Alkaline Phosphatase: 106 U/L (ref 39–117)
BUN: 23 mg/dL (ref 6–23)
CO2: 30 meq/L (ref 19–32)
Calcium: 9.6 mg/dL (ref 8.4–10.5)
Chloride: 99 meq/L (ref 96–112)
Creatinine, Ser: 0.65 mg/dL (ref 0.40–1.20)
GFR: 85.25 mL/min (ref >60.00)
Glucose, Bld: 113 mg/dL — ABNORMAL HIGH (ref 70–99)
Potassium: 3.8 meq/L (ref 3.5–5.1)
Sodium: 137 meq/L (ref 135–145)
Total Bilirubin: 0.9 mg/dL (ref 0.2–1.2)
Total Protein: 7.4 g/dL (ref 6.0–8.3)

## 2023-06-30 LAB — LIPID PANEL
Cholesterol: 201 mg/dL — ABNORMAL HIGH (ref 0–200)
HDL: 49.9 mg/dL (ref >39.00)
LDL Cholesterol: 121 mg/dL — ABNORMAL HIGH (ref 0–99)
NonHDL: 150.93
Total CHOL/HDL Ratio: 4
Triglycerides: 149 mg/dL (ref 0.0–149.0)
VLDL: 29.8 mg/dL (ref 0.0–40.0)

## 2023-06-30 LAB — CBC WITH DIFFERENTIAL/PLATELET
Basophils Absolute: 0.1 10*3/uL (ref 0.0–0.1)
Basophils Relative: 0.7 % (ref 0.0–3.0)
Eosinophils Absolute: 0.1 10*3/uL (ref 0.0–0.7)
Eosinophils Relative: 0.9 % (ref 0.0–5.0)
HCT: 42.8 % (ref 36.0–46.0)
Hemoglobin: 14.5 g/dL (ref 12.0–15.0)
Lymphocytes Relative: 9.2 % — ABNORMAL LOW (ref 12.0–46.0)
Lymphs Abs: 0.8 10*3/uL (ref 0.7–4.0)
MCHC: 33.9 g/dL (ref 30.0–36.0)
MCV: 80.1 fl (ref 78.0–100.0)
Monocytes Absolute: 0.6 10*3/uL (ref 0.1–1.0)
Monocytes Relative: 6.8 % (ref 3.0–12.0)
Neutro Abs: 7.5 10*3/uL (ref 1.4–7.7)
Neutrophils Relative %: 82.4 % — ABNORMAL HIGH (ref 43.0–77.0)
Platelets: 210 10*3/uL (ref 150.0–400.0)
RBC: 5.34 Mil/uL — ABNORMAL HIGH (ref 3.87–5.11)
RDW: 14.6 % (ref 11.5–15.5)
WBC: 9.1 10*3/uL (ref 4.0–10.5)

## 2023-06-30 LAB — HEMOGLOBIN A1C: Hgb A1c MFr Bld: 5.7 % (ref 4.6–6.5)

## 2023-06-30 NOTE — Progress Notes (Signed)
 Office Note 06/30/2023  CC:  Chief Complaint  Patient presents with   Annual Exam    Pt is fasting    HPI:  Patient is a 78 y.o. female who is here for annual health maintenance exam and follow-up hypertension and GERD.  Claudett feels well. Blood pressure at home is typically in the 120s over low 80s. She often does not take her evening dose of bisoprolol -HCTZ.  Pantoprazole  continues to work well for her GERD.  She has had some chronic left knee pain due to severe osteoarthritis.  She was going to get viscosupplementation injections but decided against it.  She walks fine on flat surfaces, avoids steps.  She is satisfied with her functionality at this time.  Past Medical History:  Diagnosis Date   Allergic rhinitis    Fall>spring   Colon cancer screening    06/2022 cologuard neg   COVID-19 virus infection 08/26/2020   Headache(784.0)    Hyperlipemia, mixed 08/2018   Statin recommended 08/2018.   Hypertension    IFG (impaired fasting glucose)    HbA1c 5.3%-5.4% 2016-2018; 5.6% 2021.   Kidney stones    x 1 episode   Left rib fracture 2017   Migraines    when "younger"   Osteoarthritis    Hands, feet, knees: takes meloxicam  occas   Osteoarthritis of left knee    Osteopenia 06/2019   T score -2.0.  Plan rpt 06/2021.   Right shoulder injury 12/2014   R AC joint arthritis--steroid injection by ortho/sportsmed MD helped.  Pt also has full thickness RC tear on right.    Past Surgical History:  Procedure Laterality Date   COLONOSCOPY  08/02/06   DEXA  06/2019   T score -2.0. Rpt 2 yrs    Family History  Problem Relation Age of Onset   Arthritis Mother    Hypertension Mother    Arthritis Father    Heart disease Father    Hypertension Father    AAA (abdominal aortic aneurysm) Father        age of death 98   Arthritis Sister    Hypertension Sister    Hypertension Other    Coronary artery disease Other    Breast cancer Neg Hx     Social History    Socioeconomic History   Marital status: Widowed    Spouse name: Not on file   Number of children: Not on file   Years of education: Not on file   Highest education level: Not on file  Occupational History   Occupation: retired  Tobacco Use   Smoking status: Never   Smokeless tobacco: Never  Substance and Sexual Activity   Alcohol use: No   Drug use: No   Sexual activity: Not on file  Other Topics Concern   Not on file  Social History Narrative   Widow (husband Butch d 2020), 1 daughter, 2 grandchildren (grand-daughter lives with her).      Occupation: Advertising account planner   No T/A/Ds.   Social Drivers of Corporate investment banker Strain: Low Risk  (07/30/2021)   Overall Financial Resource Strain (CARDIA)    Difficulty of Paying Living Expenses: Not hard at all  Food Insecurity: No Food Insecurity (09/12/2021)   Hunger Vital Sign    Worried About Running Out of Food in the Last Year: Never true    Ran Out of Food in the Last Year: Never true  Transportation Needs: No Transportation Needs (09/12/2021)   PRAPARE - Transportation  Lack of Transportation (Medical): No    Lack of Transportation (Non-Medical): No  Physical Activity: Sufficiently Active (07/30/2021)   Exercise Vital Sign    Days of Exercise per Week: 7 days    Minutes of Exercise per Session: 30 min  Stress: No Stress Concern Present (07/30/2021)   [employer] of Occupational Health - Occupational Stress Questionnaire    Feeling of Stress : Not at all  Social Connections: Unknown (05/11/2022)   [facility] from [facility]   Social Network    Social Network: Not on file  Intimate Partner Violence: Unknown (05/11/2022)   Received from [facility]   HITS    Physically Hurt: Not on file    Insult or Talk Down To: Not on file    Threaten Physical Harm: Not on file    Scream or Curse: Not on file    Outpatient Medications Prior to Visit  Medication Sig Dispense Refill    bisoprolol -hydrochlorothiazide  (ZIAC ) 5-6.25 MG tablet TAKE 2 TABLET EVERY MORNING AND 1 TABLET EVERY EVENING 270 tablet 1   diphenhydrAMINE (BENADRYL) 25 MG tablet Take 25 mg by mouth every 6 (six) hours as needed.     meloxicam  (MOBIC ) 15 MG tablet TAKE 1 TABLET BY MOUTH EVERY 24 HOURS WITH A MEAL FOR 2 WEEKS, THEN ONCE EVERY 24 HOURS AS NEEDED FOR PAIN 30 tablet 3   Multiple Vitamins-Minerals (WOMENS MULTIVITAMIN PO) Take by mouth.     Omega-3 Fatty Acids (OMEGA 3 PO) Take 1 tablet by mouth.     pantoprazole  (PROTONIX ) 40 MG tablet Take 1 tablet (40 mg total) by mouth daily. 90 tablet 1   Cetirizine HCl (ZYRTEC ALLERGY PO) Take by mouth. (Patient not taking: Reported on 06/30/2023)     cyclobenzaprine  (FLEXERIL ) 10 MG tablet Take 1 tablet (10 mg total) by mouth 3 (three) times daily as needed for muscle spasms. (Patient not taking: Reported on 06/30/2023) 90 tablet 1   HYDROcodone -acetaminophen  (NORCO/VICODIN) 5-325 MG tablet Take 1 tablet by mouth every 8 (eight) hours as needed for moderate pain. (Patient not taking: Reported on 06/30/2023) 15 tablet 0   Urea  41 % CREA Apply 1 application  topically daily. (Patient not taking: Reported on 06/30/2023) 227 g 0   No facility-administered medications prior to visit.    Allergies  Allergen Reactions   Penicillins    Sulfonamide Derivatives     Review of Systems  Constitutional:  Negative for appetite change, chills, fatigue and fever.  HENT:  Negative for congestion, dental problem, ear pain and sore throat.   Eyes:  Negative for discharge, redness and visual disturbance.  Respiratory:  Negative for cough, chest tightness, shortness of breath and wheezing.   Cardiovascular:  Negative for chest pain, palpitations and leg swelling.  Gastrointestinal:  Negative for abdominal pain, blood in stool, diarrhea, nausea and vomiting.  Genitourinary:  Negative for difficulty urinating, dysuria, flank pain, frequency, hematuria and urgency.   Musculoskeletal:  Negative for arthralgias, back pain, joint swelling, myalgias and neck stiffness.  Skin:  Negative for pallor and rash.  Neurological:  Negative for dizziness, speech difficulty, weakness and headaches.  Hematological:  Negative for adenopathy. Does not bruise/bleed easily.  Psychiatric/Behavioral:  Negative for confusion and sleep disturbance. The patient is not nervous/anxious.     PE;    06/30/2023    8:24 AM 06/30/2023    8:14 AM 12/01/2022   10:58 AM  Vitals with BMI  Height  5' 2.5"   Weight  128 lbs 10 oz 127 lbs 6 oz  BMI  23.13   Systolic 136 148 540  Diastolic 80 78 68  Pulse  55 61   Exam chaperoned by Pandora Bogaert, CMA   Gen: Alert, well appearing.  Patient is oriented to person, place, time, and situation. AFFECT: pleasant, lucid thought and speech. ENT: Ears: EACs clear, normal epithelium.  TMs with good light reflex and landmarks bilaterally.  Eyes: no injection, icteris, swelling, or exudate.  EOMI, PERRLA. Nose: no drainage or turbinate edema/swelling.  No injection or focal lesion.  Mouth: lips without lesion/swelling.  Oral mucosa pink and moist.  Dentition intact and without obvious caries or gingival swelling.  Oropharynx without erythema, exudate, or swelling.  Neck: supple/nontender.  No LAD, mass, or TM.  Carotid pulses 2+ bilaterally, without bruits. CV: RRR, no m/r/g.   LUNGS: CTA bilat, nonlabored resps, good aeration in all lung fields. ABD: soft, NT, ND, BS normal.  No hepatospenomegaly or mass.  No bruits. EXT: no clubbing, cyanosis, or edema.  Musculoskeletal: no joint swelling, erythema, warmth, or tenderness.  ROM of all joints intact. Skin - no sores or suspicious lesions or rashes or color changes  Pertinent labs:  Lab Results  Component Value Date   TSH 0.85 05/08/2016   Lab Results  Component Value Date   WBC 8.7 02/01/2020   HGB 14.0 02/01/2020   HCT 41.0 02/01/2020   MCV 81.6 02/01/2020   PLT 186.0 02/01/2020    Lab Results  Component Value Date   CREATININE 0.81 04/27/2022   BUN 20 04/27/2022   NA 139 04/27/2022   K 3.7 04/27/2022   CL 97 04/27/2022   CO2 29 04/27/2022   Lab Results  Component Value Date   ALT 18 10/13/2021   AST 21 10/13/2021   ALKPHOS 84 10/13/2021   BILITOT 1.1 10/13/2021   Lab Results  Component Value Date   CHOL 214 (H) 04/27/2022   Lab Results  Component Value Date   HDL 55.70 04/27/2022   Lab Results  Component Value Date   LDLCALC 124 (H) 04/27/2022   Lab Results  Component Value Date   TRIG 173.0 (H) 04/27/2022   Lab Results  Component Value Date   CHOLHDL 4 04/27/2022   Lab Results  Component Value Date   HGBA1C 5.6 04/27/2022   ASSESSMENT AND PLAN:   #1 Health maintenance exam: Reviewed age and gender appropriate health maintenance issues (prudent diet, regular exercise, health risks of tobacco and excessive alcohol, use of seatbelts, fire alarms in home, use of sunscreen).  Also reviewed age and gender appropriate health screening as well as vaccine recommendations. Vaccines: Shingrix-> declined.  Tdap->declined. Otherwise ALL UTD. Labs:  Fasting cmet, flp, and a1c (IFG). Cervical ca screening: no longer a candidate for cerv ca screening. Breast ca screening: ([facility]) April 2024 mammogram negative.  She will arrange this, ordered today. Colon ca screening: cologuard neg 07/2022.  Rpt 07/2025. Osteoporosis screening: DEXA 06/2019 T score -2.0.  Repeat DEXA was ordered June 2023. Ordered again today.  #2 hypertension, well-controlled on bisoprolol -HCTZ 5-6.25, 2 tabs in the morning and 1 tab in the evening.  Encouraged her to start taking her evening tab more consistently.  3.  GERD, doing well on daily Protonix .  #4 mixed hyperlipidemia. She has been on the borderline of intermediate cardiovascular risk, no meds in the past. Lipid panel today.  #5 IFG.  Monitor fasting glucose and hemoglobin A1c today.  An After Visit  Summary  was printed and given to the patient.  FOLLOW UP:  Return in about 6 months (around 12/31/2023) for routine chronic illness f/u.  Signed:  Arletha Lady, MD           06/30/2023

## 2023-07-01 ENCOUNTER — Ambulatory Visit: Payer: Self-pay | Admitting: Family Medicine

## 2023-07-09 ENCOUNTER — Other Ambulatory Visit: Payer: Self-pay

## 2023-07-09 DIAGNOSIS — I10 Essential (primary) hypertension: Secondary | ICD-10-CM

## 2023-07-09 DIAGNOSIS — R109 Unspecified abdominal pain: Secondary | ICD-10-CM

## 2023-07-09 MED ORDER — PANTOPRAZOLE SODIUM 40 MG PO TBEC: 40.0000 mg | DELAYED_RELEASE_TABLET | Freq: Every day | ORAL | 1 refills | Status: DC

## 2023-07-09 MED ORDER — BISOPROLOL-HYDROCHLOROTHIAZIDE 5-6.25 MG PO TABS: ORAL_TABLET | ORAL | 1 refills | Status: DC

## 2023-09-24 ENCOUNTER — Ambulatory Visit
Admission: RE | Admit: 2023-09-24 | Discharge: 2023-09-24 | Disposition: A | Discharge status: Home / Self Care | Source: Ambulatory Visit | Attending: Family Medicine | Admitting: Family Medicine

## 2023-09-24 DIAGNOSIS — Z1231 Encounter for screening mammogram for malignant neoplasm of breast: Secondary | ICD-10-CM | POA: Diagnosis not present

## 2023-10-05 ENCOUNTER — Encounter: Payer: Self-pay | Admitting: Sports Medicine

## 2023-11-09 ENCOUNTER — Telehealth: Payer: Self-pay | Admitting: Family Medicine

## 2023-11-09 MED ORDER — FLUNISOLIDE 25 MCG/ACT (0.025%) NA SOLN: 2.0000 | Freq: Two times a day (BID) | NASAL | 1 refills | Status: AC

## 2023-11-09 NOTE — Telephone Encounter (Signed)
 Copied from CRM #[id number]. Topic: Clinical - Medication Refill >> Nov 09, 2023  1:40 PM Tanazia G wrote: Medication: flunisolide  (NASALIDE ) 25 MCG/ACT (0.025%) SOLN  Has the patient contacted their pharmacy? Yes (Agent: If no, request that the patient contact the pharmacy for the refill. If patient does not wish to contact the pharmacy document the reason why and proceed with request.) (Agent: If yes, when and what did the pharmacy advise?)  This is the patient's preferred pharmacy:  [phone] - [place], [place] - [address] [place] [place] [address] Phone: [phone] Fax: [phone]  Is this the correct pharmacy for this prescription? Yes If no, delete pharmacy and type the correct one.   Has the prescription been filled recently? Yes  Is the patient out of the medication? Yes  Has the patient been seen for an appointment in the last year OR does the patient have an upcoming appointment? Yes  Can we respond through MyChart? Yes  Agent: Please be advised that Rx refills may take up to 3 business days. We ask that you follow-up with your pharmacy.

## 2023-12-02 ENCOUNTER — Other Ambulatory Visit: Payer: Self-pay | Admitting: Family Medicine

## 2023-12-31 ENCOUNTER — Ambulatory Visit: Admitting: Family Medicine

## 2024-01-03 ENCOUNTER — Ambulatory Visit: Admitting: Family Medicine

## 2024-01-03 DIAGNOSIS — R109 Unspecified abdominal pain: Secondary | ICD-10-CM

## 2024-01-03 DIAGNOSIS — I10 Essential (primary) hypertension: Secondary | ICD-10-CM

## 2024-01-09 ENCOUNTER — Other Ambulatory Visit: Payer: Self-pay | Admitting: Family Medicine

## 2024-01-09 DIAGNOSIS — R109 Unspecified abdominal pain: Secondary | ICD-10-CM

## 2024-01-25 ENCOUNTER — Ambulatory Visit: Admitting: Family Medicine

## 2024-01-25 ENCOUNTER — Ambulatory Visit: Payer: Self-pay | Admitting: Family Medicine

## 2024-01-25 ENCOUNTER — Encounter: Payer: Self-pay | Admitting: Family Medicine

## 2024-01-25 VITALS — BP 130/78 | HR 54 | Temp 97.5°F | Ht 62.5 in | Wt 124.4 lb

## 2024-01-25 DIAGNOSIS — E2839 Other primary ovarian failure: Secondary | ICD-10-CM

## 2024-01-25 DIAGNOSIS — I10 Essential (primary) hypertension: Secondary | ICD-10-CM | POA: Diagnosis not present

## 2024-01-25 DIAGNOSIS — M542 Cervicalgia: Secondary | ICD-10-CM | POA: Diagnosis not present

## 2024-01-25 DIAGNOSIS — M150 Primary generalized (osteo)arthritis: Secondary | ICD-10-CM | POA: Diagnosis not present

## 2024-01-25 DIAGNOSIS — R7301 Impaired fasting glucose: Secondary | ICD-10-CM | POA: Diagnosis not present

## 2024-01-25 DIAGNOSIS — E7800 Pure hypercholesterolemia, unspecified: Secondary | ICD-10-CM

## 2024-01-25 DIAGNOSIS — K219 Gastro-esophageal reflux disease without esophagitis: Secondary | ICD-10-CM

## 2024-01-25 LAB — BASIC METABOLIC PANEL WITH GFR
BUN: 23 mg/dL (ref 6–23)
CO2: 31 meq/L (ref 19–32)
Calcium: 9.4 mg/dL (ref 8.4–10.5)
Chloride: 99 meq/L (ref 96–112)
Creatinine, Ser: 0.69 mg/dL (ref 0.40–1.20)
GFR: 83.69 mL/min
Glucose, Bld: 96 mg/dL (ref 70–99)
Potassium: 4.1 meq/L (ref 3.5–5.1)
Sodium: 138 meq/L (ref 135–145)

## 2024-01-25 LAB — LIPID PANEL
Cholesterol: 191 mg/dL (ref 28–200)
HDL: 43.4 mg/dL
LDL Cholesterol: 116 mg/dL — ABNORMAL HIGH (ref 10–99)
NonHDL: 147.96
Total CHOL/HDL Ratio: 4
Triglycerides: 159 mg/dL — ABNORMAL HIGH (ref 10.0–149.0)
VLDL: 31.8 mg/dL (ref 0.0–40.0)

## 2024-01-25 LAB — HEMOGLOBIN A1C: Hgb A1c MFr Bld: 5.6 % (ref 4.6–6.5)

## 2024-01-25 MED ORDER — BISOPROLOL-HYDROCHLOROTHIAZIDE 5-6.25 MG PO TABS: ORAL_TABLET | ORAL | 3 refills | Status: AC

## 2024-01-25 MED ORDER — TRAMADOL HCL 50 MG PO TABS: 50.0000 mg | ORAL_TABLET | Freq: Three times a day (TID) | ORAL | 0 refills | Status: AC | PRN

## 2024-01-25 NOTE — Progress Notes (Signed)
 OFFICE VISIT  01/25/2024  CC:  Chief Complaint  Patient presents with   Medical Management of Chronic Issues    Pt is fasting    Patient is a 78 y.o. female who presents for 25-month follow-up hypertension, hyperlipidemia, GERD, and IFG. A/P as of last visit: #1 hypertension, well-controlled on bisoprolol -HCTZ 5-6.25, 2 tabs in the morning and 1 tab in the evening.  Encouraged her to start taking her evening tab more consistently.   #2 GERD, doing well on daily Protonix .   #3 mixed hyperlipidemia. She has been on the borderline of intermediate cardiovascular risk, no meds in the past. Lipid panel today.   #4 IFG.  Monitor fasting glucose and hemoglobin A1c today.  INTERIM HX: Sandy Gill is doing well. She walks for exercise, keeps very busy.  She monitors blood pressure at home and it averages around 130/80.  She suffers from some arthritic pain in her neck and hands and knees. She is still functional, has some trouble going up stairs comfortably though.  Sometimes goes to the chiropractor for her neck.  She takes meloxicam  approximately 3 days a week.  It brings mild brief relief.  Pantoprazole  is very helpful for her GERD.  ROS as above, plus--> no fevers, no CP, no SOB, no wheezing, no cough, no dizziness, no HAs, no rashes, no melena/hematochezia.  No polyuria or polydipsia.   No focal weakness, paresthesias, or tremors.  No acute vision or hearing abnormalities.  No dysuria or unusual/new urinary urgency or frequency.  No recent changes in lower legs. No n/v/d or abd pain.  No palpitations.     Past Medical History:  Diagnosis Date   Allergic rhinitis    Fall>spring   Colon cancer screening    06/2022 cologuard neg   COVID-19 virus infection 08/26/2020   Headache(784.0)    Hyperlipemia, mixed 08/2018   Statin recommended 08/2018.   Hypertension    IFG (impaired fasting glucose)    HbA1c 5.3%-5.4% 2016-2018; 5.6% 2021.   Kidney stones    x 1 episode   Left rib  fracture 2017   Migraines    when younger   Osteoarthritis    Hands, feet, knees: takes meloxicam  occas   Osteoarthritis of left knee    Osteopenia 06/2019   T score -2.0.  Plan rpt 06/2021.   Right shoulder injury 12/2014   R AC joint arthritis--steroid injection by ortho/sportsmed MD helped.  Pt also has full thickness RC tear on right.    Past Surgical History:  Procedure Laterality Date   COLONOSCOPY  08/02/06   DEXA  06/2019   T score -2.0. Rpt 2 yrs    Outpatient Medications Prior to Visit  Medication Sig Dispense Refill   Cetirizine HCl (ZYRTEC ALLERGY PO) Take by mouth.     cyclobenzaprine  (FLEXERIL ) 10 MG tablet Take 1 tablet (10 mg total) by mouth 3 (three) times daily as needed for muscle spasms. 90 tablet 1   diphenhydrAMINE (BENADRYL) 25 MG tablet Take 25 mg by mouth every 6 (six) hours as needed.     flunisolide  (NASALIDE ) 25 MCG/ACT (0.025%) SOLN Place 2 sprays into the nose 2 (two) times daily. 25 mL 1   meloxicam  (MOBIC ) 15 MG tablet TAKE 1 TABLET BY MOUTH EVERY 24 HOURS WITH A MEAL FOR 2 WEEKS, THEN ONCE EVERY 24 HOURS AS NEEDED FOR PAIN 30 tablet 3   Multiple Vitamins-Minerals (WOMENS MULTIVITAMIN PO) Take by mouth.     Omega-3 Fatty Acids (OMEGA 3 PO) Take 1  tablet by mouth.     pantoprazole  (PROTONIX ) 40 MG tablet TAKE 1 TABLET BY MOUTH EVERY DAY 90 tablet 0   bisoprolol -hydrochlorothiazide  (ZIAC ) 5-6.25 MG tablet TAKE 2 TABLET EVERY MORNING AND 1 TABLET EVERY EVENING 270 tablet 1   No facility-administered medications prior to visit.    Allergies[1]  Review of Systems As per HPI  PE:    01/25/2024    8:12 AM 06/30/2023    8:24 AM 06/30/2023    8:14 AM  Vitals with BMI  Height 5' 2.5  5' 2.5  Weight 124 lbs 6 oz  128 lbs 10 oz  BMI 22.38  23.13  Systolic 130 136 851  Diastolic 78 80 78  Pulse 54  55     Physical Exam  Gen: Alert, well appearing.  Patient is oriented to person, place, time, and situation. AFFECT: pleasant, lucid thought  and speech. CV: RRR, no m/r/g.   LUNGS: CTA bilat, nonlabored resps, good aeration in all lung fields. EXT: no clubbing or cyanosis.  no edema.    LABS:  Last CBC Lab Results  Component Value Date   WBC 9.1 06/30/2023   HGB 14.5 06/30/2023   HCT 42.8 06/30/2023   MCV 80.1 06/30/2023   MCH 28.2 08/26/2018   RDW 14.6 06/30/2023   PLT 210.0 06/30/2023   Last metabolic panel Lab Results  Component Value Date   GLUCOSE 113 (H) 06/30/2023   NA 137 06/30/2023   K 3.8 06/30/2023   CL 99 06/30/2023   CO2 30 06/30/2023   BUN 23 06/30/2023   CREATININE 0.65 06/30/2023   GFR 85.25 06/30/2023   CALCIUM  9.6 06/30/2023   PROT 7.4 06/30/2023   ALBUMIN 4.7 06/30/2023   BILITOT 0.9 06/30/2023   ALKPHOS 106 06/30/2023   AST 18 06/30/2023   ALT 16 06/30/2023   Last lipids Lab Results  Component Value Date   CHOL 201 (H) 06/30/2023   HDL 49.90 06/30/2023   LDLCALC 121 (H) 06/30/2023   LDLDIRECT 100.0 07/30/2020   TRIG 149.0 06/30/2023   CHOLHDL 4 06/30/2023   Last hemoglobin A1c Lab Results  Component Value Date   HGBA1C 5.7 06/30/2023   Last vitamin D Lab Results  Component Value Date   VD25OH 44 10/30/2008   IMPRESSION AND PLAN:  1 hypertension, well-controlled on bisoprolol -HCTZ 5-6.25, 2 tabs in the morning and 1 tab in the evening.  Encouraged her to start taking her evening tab more consistently. BMET today.   #2 GERD, doing well on daily Protonix .   #3 mixed hyperlipidemia. She has been on the borderline of intermediate cardiovascular risk, no meds in the past. Lipid panel today.   #4 IFG.  Monitor fasting glucose and hemoglobin A1c today.  #5  Osteoarthritis pain--> neck, hands, knees. She gets a little bit of relief from meloxicam  and will continue to take this several days per week. I did prescribe some tramadol  today for her to take 1/2-1 tab 3 times daily as needed, #15, no refill.  An After Visit Summary was printed and given to the patient.  FOLLOW  UP: Return in about 6 months (around 07/25/2024) for routine chronic illness f/u.  Signed:  Gerlene Hockey, MD           01/25/2024      [1]  Allergies Allergen Reactions   Penicillins    Sulfonamide Derivatives

## 2024-01-28 ENCOUNTER — Other Ambulatory Visit: Payer: Self-pay

## 2024-01-28 DIAGNOSIS — M1712 Unilateral primary osteoarthritis, left knee: Secondary | ICD-10-CM

## 2024-01-28 DIAGNOSIS — R109 Unspecified abdominal pain: Secondary | ICD-10-CM

## 2024-02-07 DIAGNOSIS — R109 Unspecified abdominal pain: Secondary | ICD-10-CM

## 2024-02-07 MED ORDER — PANTOPRAZOLE SODIUM 40 MG PO TBEC: 40.0000 mg | DELAYED_RELEASE_TABLET | Freq: Every day | ORAL | 0 refills | Status: AC

## 2024-02-07 MED ORDER — CYCLOBENZAPRINE HCL 10 MG PO TABS: 10.0000 mg | ORAL_TABLET | Freq: Three times a day (TID) | ORAL | 1 refills | Status: AC | PRN

## 2024-07-25 ENCOUNTER — Ambulatory Visit: Admitting: Family Medicine
